# Patient Record
Sex: Female | Born: 1990
Health system: Southern US, Community
[De-identification: ages and names within clinical notes are randomized; demographics above are authoritative.]

## PROBLEM LIST (undated history)

## (undated) DIAGNOSIS — F32A Depression, unspecified: Secondary | ICD-10-CM

## (undated) DIAGNOSIS — I514 Myocarditis, unspecified: Secondary | ICD-10-CM

## (undated) DIAGNOSIS — F329 Major depressive disorder, single episode, unspecified: Secondary | ICD-10-CM

## (undated) DIAGNOSIS — F419 Anxiety disorder, unspecified: Secondary | ICD-10-CM

---
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
## 2009-08-19 ENCOUNTER — Other Ambulatory Visit: Admission: RE | Admit: 2009-08-19 | Discharge: 2009-08-19 | Payer: Self-pay | Admitting: Obstetrics and Gynecology

## 2010-10-07 ENCOUNTER — Other Ambulatory Visit (HOSPITAL_COMMUNITY)
Admission: RE | Admit: 2010-10-07 | Discharge: 2010-10-07 | Disposition: A | Discharge status: Home / Self Care | Payer: BC Managed Care – PPO | Source: Ambulatory Visit | Attending: Obstetrics and Gynecology | Admitting: Obstetrics and Gynecology

## 2010-10-07 DIAGNOSIS — Z113 Encounter for screening for infections with a predominantly sexual mode of transmission: Secondary | ICD-10-CM | POA: Insufficient documentation

## 2010-10-07 DIAGNOSIS — Z01419 Encounter for gynecological examination (general) (routine) without abnormal findings: Secondary | ICD-10-CM | POA: Insufficient documentation

## 2010-12-01 LAB — POCT I-STAT CREATININE: Creatinine, Ser: 0.9

## 2010-12-01 LAB — I-STAT 8, (EC8 V) (CONVERTED LAB)
BUN: 7
Chloride: 109
Glucose, Bld: 87
Operator id: 257131
Potassium: 3.8
Sodium: 141
pCO2, Ven: 32 — ABNORMAL LOW
pH, Ven: 7.443 — ABNORMAL HIGH

## 2010-12-01 LAB — CBC
HCT: 40
Hemoglobin: 13
MCV: 78.6 — ABNORMAL LOW
RBC: 5.09
RDW: 15.2 — ABNORMAL HIGH

## 2010-12-01 LAB — SAMPLE TO BLOOD BANK

## 2013-10-31 ENCOUNTER — Ambulatory Visit (INDEPENDENT_AMBULATORY_CARE_PROVIDER_SITE_OTHER): Payer: BC Managed Care – PPO | Admitting: Psychiatry

## 2013-10-31 DIAGNOSIS — F3289 Other specified depressive episodes: Secondary | ICD-10-CM

## 2013-10-31 DIAGNOSIS — F411 Generalized anxiety disorder: Secondary | ICD-10-CM

## 2013-10-31 DIAGNOSIS — F329 Major depressive disorder, single episode, unspecified: Secondary | ICD-10-CM

## 2013-10-31 DIAGNOSIS — F1010 Alcohol abuse, uncomplicated: Secondary | ICD-10-CM

## 2013-11-05 ENCOUNTER — Ambulatory Visit (INDEPENDENT_AMBULATORY_CARE_PROVIDER_SITE_OTHER): Payer: BC Managed Care – PPO | Admitting: Psychiatry

## 2013-11-05 DIAGNOSIS — F1010 Alcohol abuse, uncomplicated: Secondary | ICD-10-CM

## 2013-11-05 DIAGNOSIS — F329 Major depressive disorder, single episode, unspecified: Secondary | ICD-10-CM

## 2013-11-05 DIAGNOSIS — F3289 Other specified depressive episodes: Secondary | ICD-10-CM

## 2013-11-05 DIAGNOSIS — F411 Generalized anxiety disorder: Secondary | ICD-10-CM

## 2013-11-12 ENCOUNTER — Ambulatory Visit (INDEPENDENT_AMBULATORY_CARE_PROVIDER_SITE_OTHER): Payer: BC Managed Care – PPO | Admitting: Psychiatry

## 2013-11-12 DIAGNOSIS — F3289 Other specified depressive episodes: Secondary | ICD-10-CM

## 2013-11-12 DIAGNOSIS — F329 Major depressive disorder, single episode, unspecified: Secondary | ICD-10-CM

## 2013-11-26 ENCOUNTER — Ambulatory Visit (INDEPENDENT_AMBULATORY_CARE_PROVIDER_SITE_OTHER): Payer: BC Managed Care – PPO | Admitting: Psychiatry

## 2013-11-26 DIAGNOSIS — F1010 Alcohol abuse, uncomplicated: Secondary | ICD-10-CM

## 2013-11-26 DIAGNOSIS — F329 Major depressive disorder, single episode, unspecified: Secondary | ICD-10-CM

## 2013-11-26 DIAGNOSIS — F411 Generalized anxiety disorder: Secondary | ICD-10-CM

## 2013-12-10 ENCOUNTER — Ambulatory Visit: Payer: BC Managed Care – PPO | Admitting: Psychiatry

## 2016-12-08 DIAGNOSIS — F1010 Alcohol abuse, uncomplicated: Secondary | ICD-10-CM | POA: Diagnosis not present

## 2016-12-08 DIAGNOSIS — F411 Generalized anxiety disorder: Secondary | ICD-10-CM | POA: Diagnosis not present

## 2017-03-05 ENCOUNTER — Ambulatory Visit (HOSPITAL_COMMUNITY)
Admission: EM | Admit: 2017-03-05 | Discharge: 2017-03-05 | Disposition: A | Discharge status: Home / Self Care | Payer: BLUE CROSS/BLUE SHIELD | Attending: Family Medicine | Admitting: Family Medicine

## 2017-03-05 ENCOUNTER — Other Ambulatory Visit: Payer: Self-pay

## 2017-03-05 ENCOUNTER — Encounter (HOSPITAL_COMMUNITY): Payer: Self-pay | Admitting: *Deleted

## 2017-03-05 DIAGNOSIS — J029 Acute pharyngitis, unspecified: Secondary | ICD-10-CM | POA: Diagnosis not present

## 2017-03-05 DIAGNOSIS — Z79899 Other long term (current) drug therapy: Secondary | ICD-10-CM | POA: Diagnosis not present

## 2017-03-05 LAB — POCT RAPID STREP A: STREPTOCOCCUS, GROUP A SCREEN (DIRECT): NEGATIVE

## 2017-03-05 MED ORDER — CHLORHEXIDINE GLUCONATE 0.12 % MT SOLN: 15.0000 mL | Freq: Two times a day (BID) | OROMUCOSAL | 0 refills | Status: DC

## 2017-03-05 NOTE — ED Provider Notes (Signed)
  [facility]   [id number] 03/05/17 Arrival Time: 1557   SUBJECTIVE:  Claire ShownCasey D Chea is a 27 y.o. female who presents to the urgent care with complaint of sore throat x approx 1 month.  Describes intermittent lymphadenopathy x 2 wks with exterior right neck tenderness.  Denies fevers.  C/O some fatigue, otherwise "feel fine".  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 No pertinent past medical history. History reviewed. No pertinent family history. Social History   Socioeconomic History  . Marital status: Single    Spouse name: Not on file  . Number of children: Not on file  . Years of education: Not on file  . Highest education level: Not on file  Social Needs  . Financial resource strain: Not on file  . Food insecurity - worry: Not on file  . Food insecurity - inability: Not on file  . Transportation needs - medical: Not on file  . Transportation needs - non-medical: Not on file  Occupational History  . Not on file  Tobacco Use  . Smoking status: Never Smoker  Substance and Sexual Activity  . Alcohol use: Yes    Comment: occasionally  . Drug use: No  . Sexual activity: Not on file  Other Topics Concern  . Not on file  Social History Narrative  . Not on file   Current Meds  Medication Sig  . Venlafaxine HCl (EFFEXOR XR PO) Take by mouth.   No Known Allergies    ROS: As per HPI, remainder of ROS negative.   OBJECTIVE:   Vitals:   03/05/17 1608  BP: 133/80  Pulse: (!) 103  Resp: 18  Temp: 98.6 F (37 C)  TempSrc: Oral  SpO2: 100%     General appearance: alert; no distress Eyes: PERRL; EOMI; conjunctiva normal HENT: normocephalic; atraumatic; TMs normal, canal normal, external ears normal without trauma; nasal mucosa normal; oral mucosa normal Neck: supple Abdomen: soft, non-tender; bowel sounds normal; no masses or organomegaly; no guarding or rebound  tenderness Back: no CVA tenderness Extremities: no cyanosis or edema; symmetrical with no gross deformities Skin: warm and dry Neurologic: normal gait; grossly normal Psychological: alert and cooperative; normal mood and affect      Labs:  Results for orders placed or performed during the hospital encounter of 03/05/17  POCT rapid strep A [facility]  Result Value Ref Range   Streptococcus, Group A Screen (Direct) NEGATIVE NEGATIVE    Labs Reviewed  CULTURE, GROUP A STREP [facility])  POCT RAPID STREP A    No results found.     ASSESSMENT & PLAN:  1. Sore throat     Meds ordered this encounter  Medications  . chlorhexidine (PERIDEX) 0.12 % solution    Sig: Use as directed 15 mLs in the mouth or throat 2 (two) times daily.    Dispense:  120 mL    Refill:  0    Reviewed expectations re: course of current medical issues. Questions answered. Outlined signs and symptoms indicating need for more acute intervention. Patient verbalized understanding. After Visit Summary given.    Procedures:      Elvina SidleLauenstein, Bassem Bernasconi, MD 03/05/17 1645

## 2017-03-05 NOTE — ED Triage Notes (Signed)
C/O sore throat x approx 1 month.  Describes intermittent lymphadenopathy x 2 wks with exterior right neck tenderness.  Denies fevers.  C/O some fatigue, otherwise "feel fine".

## 2017-03-08 LAB — CULTURE, GROUP A STREP (THRC)

## 2017-11-16 ENCOUNTER — Encounter (HOSPITAL_COMMUNITY): Payer: Self-pay

## 2017-11-16 ENCOUNTER — Inpatient Hospital Stay (HOSPITAL_COMMUNITY)
Admission: AD | Admit: 2017-11-16 | Discharge: 2017-11-17 | Disposition: A | Discharge status: Home / Self Care | Payer: BLUE CROSS/BLUE SHIELD | Source: Ambulatory Visit | Attending: Obstetrics & Gynecology | Admitting: Obstetrics & Gynecology

## 2017-11-16 DIAGNOSIS — R1032 Left lower quadrant pain: Secondary | ICD-10-CM | POA: Insufficient documentation

## 2017-11-16 DIAGNOSIS — R109 Unspecified abdominal pain: Secondary | ICD-10-CM | POA: Diagnosis not present

## 2017-11-16 DIAGNOSIS — R82998 Other abnormal findings in urine: Secondary | ICD-10-CM

## 2017-11-16 LAB — CBC WITH DIFFERENTIAL/PLATELET
BASOS PCT: 1 %
Basophils Absolute: 0 10*3/uL (ref 0.0–0.1)
Eosinophils Absolute: 0.3 10*3/uL (ref 0.0–0.7)
Eosinophils Relative: 5 %
HEMATOCRIT: 37.8 % (ref 36.0–46.0)
Hemoglobin: 12.2 g/dL (ref 12.0–15.0)
Lymphocytes Relative: 37 %
Lymphs Abs: 2.4 10*3/uL (ref 0.7–4.0)
MCH: 26.8 pg (ref 26.0–34.0)
MCHC: 32.3 g/dL (ref 30.0–36.0)
MCV: 82.9 fL (ref 78.0–100.0)
MONOS PCT: 4 %
Monocytes Absolute: 0.2 10*3/uL (ref 0.1–1.0)
NEUTROS ABS: 3.5 10*3/uL (ref 1.7–7.7)
NEUTROS PCT: 53 %
Platelets: 231 10*3/uL (ref 150–400)
RBC: 4.56 MIL/uL (ref 3.87–5.11)
RDW: 14.2 % (ref 11.5–15.5)
WBC: 6.5 10*3/uL (ref 4.0–10.5)

## 2017-11-16 LAB — COMPREHENSIVE METABOLIC PANEL
ALBUMIN: 4.6 g/dL (ref 3.5–5.0)
ALT: 12 U/L (ref 0–44)
ANION GAP: 8 (ref 5–15)
AST: 15 U/L (ref 15–41)
Alkaline Phosphatase: 44 U/L (ref 38–126)
BILIRUBIN TOTAL: 0.5 mg/dL (ref 0.3–1.2)
BUN: 9 mg/dL (ref 6–20)
CALCIUM: 9 mg/dL (ref 8.9–10.3)
CO2: 26 mmol/L (ref 22–32)
Chloride: 105 mmol/L (ref 98–111)
Creatinine, Ser: 0.65 mg/dL (ref 0.44–1.00)
GFR calc Af Amer: >60 mL/min (ref >60)
GLUCOSE: 87 mg/dL (ref 70–99)
Potassium: 3.8 mmol/L (ref 3.5–5.1)
Sodium: 139 mmol/L (ref 135–145)
TOTAL PROTEIN: 6.9 g/dL (ref 6.5–8.1)

## 2017-11-16 LAB — URINALYSIS, ROUTINE W REFLEX MICROSCOPIC
Bilirubin Urine: NEGATIVE
Glucose, UA: NEGATIVE mg/dL
Hgb urine dipstick: NEGATIVE
KETONES UR: NEGATIVE mg/dL
Nitrite: NEGATIVE
PROTEIN: NEGATIVE mg/dL
Specific Gravity, Urine: 1.012 (ref 1.005–1.030)
pH: 6 (ref 5.0–8.0)

## 2017-11-16 LAB — LIPASE, BLOOD: Lipase: 35 U/L (ref 11–51)

## 2017-11-16 LAB — POCT PREGNANCY, URINE: Preg Test, Ur: NEGATIVE

## 2017-11-16 LAB — AMYLASE: Amylase: 69 U/L (ref 28–100)

## 2017-11-16 NOTE — Discharge Instructions (Signed)

## 2017-11-16 NOTE — MAU Provider Note (Signed)
Chief Complaint:  Hip Pain   First Provider Initiated Contact with Patient 11/16/17 2207      HPI: Diana Gomez is a 27 y.o. G1P0010 who presents to maternity admissions reporting dull ache in left side from iliac crest to left flank for the past 6 months.  States it has gotten worse lately.  Mostly feels worse after eating. States feels full for a long time after eating.  Has an appointment Monday with her PCP to evaluate it.  Not worried about STDs. . She reports no vaginal bleeding, vaginal itching/burning, urinary symptoms, h/a, dizziness, n/v, or fever/chills.    Abdominal Pain  This is a recurrent problem. The current episode started more than 1 month ago. The onset quality is gradual. The problem occurs intermittently. The problem has been gradually worsening. The pain is located in the LLQ, LUQ and left flank. The quality of the pain is dull. Pertinent negatives include no anorexia, constipation, diarrhea, dysuria, fever, frequency, myalgias, nausea or vomiting. The pain is aggravated by eating (Feels full for a long time after eating). The pain is relieved by nothing. She has tried nothing for the symptoms.    RN note: Feeling a dull ache in her left side for the past 6 months.  In the past 2 weeks has gotten worse and is experiencing feeling a lot of pressure in her side.  LMP 10/22/17.  No VB.  Has not taken anything for the pain recently.  Has an appointment on Monday with a PCP  Past Medical History: History reviewed. No pertinent past medical history.  Past obstetric history: OB History  Gravida Para Term Preterm AB Living  1       1    SAB TAB Ectopic Multiple Live Births    1          # Outcome Date GA Lbr Len/2nd Weight Sex Delivery Anes PTL Lv  1 TAB 2014            Past Surgical History: History reviewed. No pertinent surgical history.  Family History: No family history on file.  Social History: Social History   Tobacco Use  . Smoking status: Never  Smoker  Substance Use Topics  . Alcohol use: Yes    Comment: occasionally  . Drug use: No    Allergies: No Known Allergies  Meds:  Medications Prior to Admission  Medication Sig Dispense Refill Last Dose  . Venlafaxine HCl (EFFEXOR XR PO) Take 150 mg by mouth.    11/16/2017 at Unknown time  . chlorhexidine (PERIDEX) 0.12 % solution Use as directed 15 mLs in the mouth or throat 2 (two) times daily. 120 mL 0     I have reviewed patient's Past Medical Hx, Surgical Hx, Family Hx, Social Hx, medications and allergies.  ROS:  Review of Systems  Constitutional: Negative for fever.  Gastrointestinal: Positive for abdominal pain. Negative for anorexia, constipation, diarrhea, nausea and vomiting.  Genitourinary: Negative for dysuria and frequency.  Musculoskeletal: Negative for myalgias.   Other systems negative     Physical Exam   Patient Vitals for the past 24 hrs:  BP Temp Pulse Resp SpO2 Height Weight  11/16/17 2107 140/77 99.2 F (37.3 C) 100 19 100 % 5\' 3"  (1.6 m) 60.7 kg   Constitutional: Well-developed, well-nourished female in no acute distress.  Cardiovascular: normal rate and rhythm, no ectopy audible, S1 & S2 heard, no murmur Respiratory: normal effort, no distress. Lungs CTAB with no wheezes or crackles GI: Abd  soft, non-tender.  Nondistended.  No rebound, No guarding.  Bowel Sounds audible  MS: Extremities nontender, no edema, normal ROM Neurologic: Alert and oriented x 4.   Grossly nonfocal. GU: Neg CVAT. Skin:  Warm and Dry Psych:  Affect appropriate.  PELVIC EXAM: Cervix pink, visually closed, without lesion, scant white creamy discharge, vaginal walls and external genitalia normal Bimanual exam: Cervix firm, anterior, neg CMT, uterus nontender, nonenlarged, adnexa without tenderness, enlargement, or mass   Pressure on Left adnexa does NOT reproduce her pain.    Labs: Results for orders placed or performed during the hospital encounter of 11/16/17 (from the  past 24 hour(s))  Urinalysis, Routine w reflex microscopic     Status: Abnormal   Collection Time: 11/16/17  9:30 PM  Result Value Ref Range   Color, Urine YELLOW YELLOW   APPearance CLEAR CLEAR   Specific Gravity, Urine 1.012 1.005 - 1.030   pH 6.0 5.0 - 8.0   Glucose, UA NEGATIVE NEGATIVE mg/dL   Hgb urine dipstick NEGATIVE NEGATIVE   Bilirubin Urine NEGATIVE NEGATIVE   Ketones, ur NEGATIVE NEGATIVE mg/dL   Protein, ur NEGATIVE NEGATIVE mg/dL   Nitrite NEGATIVE NEGATIVE   Leukocytes, UA MODERATE (A) NEGATIVE   RBC / HPF 0-5 0 - 5 RBC/hpf   WBC, UA 0-5 0 - 5 WBC/hpf   Bacteria, UA RARE (A) NONE SEEN   Squamous Epithelial / LPF 0-5 0 - 5   Mucus PRESENT   Pregnancy, urine POC     Status: None   Collection Time: 11/16/17  9:34 PM  Result Value Ref Range   Preg Test, Ur NEGATIVE NEGATIVE  CBC with Differential/Platelet     Status: None   Collection Time: 11/16/17 10:32 PM  Result Value Ref Range   WBC 6.5 4.0 - 10.5 K/uL   RBC 4.56 3.87 - 5.11 MIL/uL   Hemoglobin 12.2 12.0 - 15.0 g/dL   HCT 40.9 81.1 - 91.4 %   MCV 82.9 78.0 - 100.0 fL   MCH 26.8 26.0 - 34.0 pg   MCHC 32.3 30.0 - 36.0 g/dL   RDW 78.2 95.6 - 21.3 %   Platelets 231 150 - 400 K/uL   Neutrophils Relative % 53 %   Neutro Abs 3.5 1.7 - 7.7 K/uL   Lymphocytes Relative 37 %   Lymphs Abs 2.4 0.7 - 4.0 K/uL   Monocytes Relative 4 %   Monocytes Absolute 0.2 0.1 - 1.0 K/uL   Eosinophils Relative 5 %   Eosinophils Absolute 0.3 0.0 - 0.7 K/uL   Basophils Relative 1 %   Basophils Absolute 0.0 0.0 - 0.1 K/uL  Comprehensive metabolic panel     Status: None   Collection Time: 11/16/17 10:32 PM  Result Value Ref Range   Sodium 139 135 - 145 mmol/L   Potassium 3.8 3.5 - 5.1 mmol/L   Chloride 105 98 - 111 mmol/L   CO2 26 22 - 32 mmol/L   Glucose, Bld 87 70 - 99 mg/dL   BUN 9 6 - 20 mg/dL   Creatinine, Ser 0.86 0.44 - 1.00 mg/dL   Calcium 9.0 8.9 - 57.8 mg/dL   Total Protein 6.9 6.5 - 8.1 g/dL   Albumin 4.6 3.5 -  5.0 g/dL   AST 15 15 - 41 U/L   ALT 12 0 - 44 U/L   Alkaline Phosphatase 44 38 - 126 U/L   Total Bilirubin 0.5 0.3 - 1.2 mg/dL   GFR calc non Af Amer >60 >60  mL/min   GFR calc Af Amer >60 >60 mL/min   Anion gap 8 5 - 15  Lipase, blood     Status: None   Collection Time: 11/16/17 10:32 PM  Result Value Ref Range   Lipase 35 11 - 51 U/L  Amylase     Status: None   Collection Time: 11/16/17 10:32 PM  Result Value Ref Range   Amylase 69 28 - 100 U/L    Imaging:  No results found.  MAU Course/MDM: I have ordered labs as follows: see above.  Reviewed CBC and CMET are normal, as are lipase and amylase Leukocytes in urine might reflect UTI.  Will culture and treat with short course Septra Ds Imaging ordered: none Results reviewed.    Treatments in MAU included none.   Pt stable at time of discharge.  Assessment: Left lower abdominal pain Left flank pain Feeling full for prolonged time after eating  Plan: Discharge home Recommend Follow up with primary doctor and notify him you were seen here  Rx sent for Septra DS for Possible UTI x 3 days, urine to culture  Encouraged to return here or to other Urgent Care/ED if she develops worsening of symptoms, increase in pain, fever, or other concerning symptoms.   Wynelle Bourgeois CNM, MSN Certified Nurse-Midwife 11/16/2017 10:08 PM

## 2017-11-16 NOTE — MAU Note (Signed)
Feeling a dull ache in her left side for the past 6 months.  In the past 2 weeks has gotten worse and is experiencing feeling a lot of pressure in her side.  LMP 10/22/17.  No VB.  Has not taken anything for the pain recently.  Has an appointment on Monday with a PCP.

## 2017-11-17 MED ORDER — SULFAMETHOXAZOLE-TRIMETHOPRIM 800-160 MG PO TABS: 1.0000 | ORAL_TABLET | Freq: Two times a day (BID) | ORAL | 1 refills | Status: DC

## 2017-11-18 LAB — URINE CULTURE: Culture: NO GROWTH

## 2017-11-22 DIAGNOSIS — R1032 Left lower quadrant pain: Secondary | ICD-10-CM | POA: Diagnosis not present

## 2017-11-27 ENCOUNTER — Encounter (HOSPITAL_BASED_OUTPATIENT_CLINIC_OR_DEPARTMENT_OTHER): Payer: Self-pay | Admitting: *Deleted

## 2017-11-27 ENCOUNTER — Emergency Department (HOSPITAL_BASED_OUTPATIENT_CLINIC_OR_DEPARTMENT_OTHER): Payer: BLUE CROSS/BLUE SHIELD

## 2017-11-27 ENCOUNTER — Other Ambulatory Visit: Payer: Self-pay

## 2017-11-27 ENCOUNTER — Emergency Department (HOSPITAL_BASED_OUTPATIENT_CLINIC_OR_DEPARTMENT_OTHER)
Admission: EM | Admit: 2017-11-27 | Discharge: 2017-11-27 | Disposition: A | Discharge status: Home / Self Care | Payer: BLUE CROSS/BLUE SHIELD | Attending: Emergency Medicine | Admitting: Emergency Medicine

## 2017-11-27 DIAGNOSIS — R1032 Left lower quadrant pain: Secondary | ICD-10-CM | POA: Diagnosis not present

## 2017-11-27 DIAGNOSIS — Z79899 Other long term (current) drug therapy: Secondary | ICD-10-CM | POA: Diagnosis not present

## 2017-11-27 DIAGNOSIS — R109 Unspecified abdominal pain: Secondary | ICD-10-CM | POA: Diagnosis not present

## 2017-11-27 DIAGNOSIS — K529 Noninfective gastroenteritis and colitis, unspecified: Secondary | ICD-10-CM | POA: Diagnosis not present

## 2017-11-27 LAB — CBC WITH DIFFERENTIAL/PLATELET
BASOS ABS: 0 10*3/uL (ref 0.0–0.1)
Basophils Relative: 1 %
Eosinophils Absolute: 0.1 10*3/uL (ref 0.0–0.7)
Eosinophils Relative: 2 %
HEMATOCRIT: 39.2 % (ref 36.0–46.0)
Hemoglobin: 13.1 g/dL (ref 12.0–15.0)
LYMPHS ABS: 1.4 10*3/uL (ref 0.7–4.0)
LYMPHS PCT: 26 %
MCH: 27.1 pg (ref 26.0–34.0)
MCHC: 33.4 g/dL (ref 30.0–36.0)
MCV: 81 fL (ref 78.0–100.0)
Monocytes Absolute: 0.4 10*3/uL (ref 0.1–1.0)
Monocytes Relative: 8 %
NEUTROS ABS: 3.4 10*3/uL (ref 1.7–7.7)
Neutrophils Relative %: 65 %
Platelets: 200 10*3/uL (ref 150–400)
RBC: 4.84 MIL/uL (ref 3.87–5.11)
RDW: 13.6 % (ref 11.5–15.5)
WBC: 5.3 10*3/uL (ref 4.0–10.5)

## 2017-11-27 LAB — COMPREHENSIVE METABOLIC PANEL
ALK PHOS: 48 U/L (ref 38–126)
ALT: 14 U/L (ref 0–44)
AST: 19 U/L (ref 15–41)
Albumin: 4.6 g/dL (ref 3.5–5.0)
Anion gap: 10 (ref 5–15)
BILIRUBIN TOTAL: 0.5 mg/dL (ref 0.3–1.2)
BUN: 7 mg/dL (ref 6–20)
CO2: 25 mmol/L (ref 22–32)
Calcium: 9.4 mg/dL (ref 8.9–10.3)
Chloride: 102 mmol/L (ref 98–111)
Creatinine, Ser: 0.7 mg/dL (ref 0.44–1.00)
GFR calc Af Amer: >60 mL/min (ref >60)
GLUCOSE: 88 mg/dL (ref 70–99)
Potassium: 3.2 mmol/L — ABNORMAL LOW (ref 3.5–5.1)
Sodium: 137 mmol/L (ref 135–145)
Total Protein: 7.4 g/dL (ref 6.5–8.1)

## 2017-11-27 LAB — URINALYSIS, ROUTINE W REFLEX MICROSCOPIC
Bilirubin Urine: NEGATIVE
Glucose, UA: NEGATIVE mg/dL
HGB URINE DIPSTICK: NEGATIVE
Ketones, ur: NEGATIVE mg/dL
Leukocytes, UA: NEGATIVE
NITRITE: NEGATIVE
PH: 7 (ref 5.0–8.0)
Protein, ur: NEGATIVE mg/dL
Specific Gravity, Urine: <1.005 — ABNORMAL LOW (ref 1.005–1.030)

## 2017-11-27 LAB — LIPASE, BLOOD: Lipase: 25 U/L (ref 11–51)

## 2017-11-27 LAB — PREGNANCY, URINE: PREG TEST UR: NEGATIVE

## 2017-11-27 MED ORDER — AMOXICILLIN-POT CLAVULANATE 875-125 MG PO TABS
1.0000 | ORAL_TABLET | Freq: Once | ORAL | Status: AC
  Administered 2017-11-27: 1 via ORAL
  Filled 2017-11-27: qty 1

## 2017-11-27 MED ORDER — AMOXICILLIN-POT CLAVULANATE 875-125 MG PO TABS: 1.0000 | ORAL_TABLET | Freq: Two times a day (BID) | ORAL | 0 refills | Status: AC

## 2017-11-27 MED ORDER — IOPAMIDOL (ISOVUE-M 300) INJECTION 61%: 15.0000 mL | Freq: Once | INTRAMUSCULAR | Status: DC | PRN

## 2017-11-27 MED ORDER — OXYCODONE-ACETAMINOPHEN 5-325 MG PO TABS
1.0000 | ORAL_TABLET | Freq: Once | ORAL | Status: AC
  Administered 2017-11-27: 1 via ORAL
  Filled 2017-11-27: qty 1

## 2017-11-27 MED ORDER — IOPAMIDOL (ISOVUE-300) INJECTION 61%
100.0000 mL | Freq: Once | INTRAVENOUS | Status: AC | PRN
  Administered 2017-11-27: 100 mL via INTRAVENOUS

## 2017-11-27 MED ORDER — OXYCODONE-ACETAMINOPHEN 5-325 MG PO TABS: 1.0000 | ORAL_TABLET | Freq: Four times a day (QID) | ORAL | 0 refills | Status: DC | PRN

## 2017-11-27 MED ORDER — GI COCKTAIL ~~LOC~~
30.0000 mL | Freq: Once | ORAL | Status: AC
  Administered 2017-11-27: 30 mL via ORAL
  Filled 2017-11-27: qty 30

## 2017-11-27 MED ORDER — ONDANSETRON HCL 4 MG/2ML IJ SOLN
4.0000 mg | Freq: Once | INTRAMUSCULAR | Status: AC
  Administered 2017-11-27: 4 mg via INTRAVENOUS
  Filled 2017-11-27: qty 2

## 2017-11-27 MED ORDER — SODIUM CHLORIDE 0.9 % IV BOLUS
1000.0000 mL | Freq: Once | INTRAVENOUS | Status: AC
  Administered 2017-11-27: 1000 mL via INTRAVENOUS

## 2017-11-27 MED ORDER — DICYCLOMINE HCL 10 MG PO CAPS
20.0000 mg | ORAL_CAPSULE | Freq: Once | ORAL | Status: AC
  Administered 2017-11-27: 20 mg via ORAL
  Filled 2017-11-27: qty 2

## 2017-11-27 MED ORDER — DICYCLOMINE HCL 20 MG PO TABS: 20.0000 mg | ORAL_TABLET | Freq: Three times a day (TID) | ORAL | 0 refills | Status: DC

## 2017-11-27 MED ORDER — MORPHINE SULFATE (PF) 4 MG/ML IV SOLN
4.0000 mg | Freq: Once | INTRAVENOUS | Status: AC
  Administered 2017-11-27: 4 mg via INTRAVENOUS
  Filled 2017-11-27: qty 1

## 2017-11-27 NOTE — Discharge Instructions (Addendum)
As we discussed, your CT scan today showed colitis. This could be infectious or inflammatory. For now, we'll start an antibiotic and symptom treatment.  Given how long your symptoms have lasted, I'd recommend being seen by a GI doctor in the next few weeks, to make sure there is nothing else that needs to be done.

## 2017-11-27 NOTE — ED Provider Notes (Signed)
[facility] EMERGENCY DEPARTMENT Provider Note   CSN: [id number] Arrival date & time: 11/27/17  1639     History   Chief Complaint Chief Complaint  Patient presents with  . Abdominal Pain    HPI Diana Gomez is a 27 y.o. female.  HPI 27 year old female here with diffuse abdominal pain.  The patient states that over the last 2 to 3 months, she has been having intermittent left lower quadrant pain.  She has had an associated 10 pound weight loss.  She saw her PCP on 9/30 and had an ultrasound which showed possible PCOS but no large or hemorrhagic cyst.  She states that since then, she is continued to have intermittent left lower quadrant pain.  However, over the last 24 hours, she is developed now diffuse, aching, severe pain.  Describes it as a stabbing, aching sensation with associated fullness.  She is had pain that worsens after eating.  Denies any alleviating factors other than intermittent position changes.  She is had increased urinary frequency but denies any overt dysuria or hematuria.  No change in her bowel habits.  She had one episode of nausea and vomiting which she thought was due to pain yesterday.  She strongly denies any vaginal bleeding or discharge and had a unremarkable pelvic exam and negative GC/C just several days ago.  Denies any recent change in her menstrual periods.  No other medical history.  Mother did have a history of early colon cancer.  Father had a history of kidney stones.  History reviewed. No pertinent past medical history.  There are no active problems to display for this patient.   History reviewed. No pertinent surgical history.   OB History    Gravida  1   Para      Term      Preterm      AB  1   Living        SAB      TAB  1   Ectopic      Multiple      Live Births               Home Medications    Prior to Admission medications   Medication Sig Start Date End Date Taking? Authorizing Provider    raNITIdine HCl (ZANTAC PO) Take by mouth.   Yes [provider]  venlafaxine XR (EFFEXOR-XR) 150 MG 24 hr capsule TAKE 1 CAPSULE BY MOUTH EVERY DAY 10/02/17  Yes [provider]  amoxicillin-clavulanate (AUGMENTIN) 875-125 MG tablet Take 1 tablet by mouth every 12 (twelve) hours for 10 days. 11/27/17 12/07/17  Shaune Pollack, MD  chlorhexidine (PERIDEX) 0.12 % solution Use as directed 15 mLs in the mouth or throat 2 (two) times daily. 03/05/17   Elvina Sidle, MD  dicyclomine (BENTYL) 20 MG tablet Take 1 tablet (20 mg total) by mouth 3 (three) times daily before meals for 10 days. 11/27/17 12/07/17  Shaune Pollack, MD  oxyCODONE-acetaminophen (PERCOCET/ROXICET) 5-325 MG tablet Take 1-2 tablets by mouth every 6 (six) hours as needed for moderate pain or severe pain. 11/27/17   Shaune Pollack, MD  sulfamethoxazole-trimethoprim (BACTRIM DS,SEPTRA DS) 800-160 MG tablet Take 1 tablet by mouth 2 (two) times daily. 11/17/17   Aviva Signs, CNM    Family History No family history on file.  Social History Social History   Tobacco Use  . Smoking status: Never Smoker  . Smokeless tobacco: Never Used  Substance Use Topics  . Alcohol  use: Yes    Comment: occasionally  . Drug use: No     Allergies   Patient has no known allergies.   Review of Systems Review of Systems  Constitutional: Positive for fatigue. Negative for chills and fever.  HENT: Negative for congestion and rhinorrhea.   Eyes: Negative for visual disturbance.  Respiratory: Negative for cough, shortness of breath and wheezing.   Cardiovascular: Negative for chest pain and leg swelling.  Gastrointestinal: Positive for abdominal pain and nausea. Negative for diarrhea and vomiting.  Genitourinary: Negative for dysuria and flank pain.  Musculoskeletal: Negative for neck pain and neck stiffness.  Skin: Negative for rash and wound.  Allergic/Immunologic: Negative for immunocompromised state.  Neurological:  Positive for weakness. Negative for syncope and headaches.  All other systems reviewed and are negative.    Physical Exam Updated Vital Signs BP 123/82 (BP Location: Left Arm)   Pulse 77   Temp 98.2 F (36.8 C) (Oral)   Resp 16   Ht 5\' 3"  (1.6 m)   Wt 60.6 kg   LMP 11/19/2017   SpO2 98%   BMI 23.67 kg/m   Physical Exam  Constitutional: She is oriented to person, place, and time. She appears well-developed and well-nourished. No distress.  HENT:  Head: Normocephalic and atraumatic.  Eyes: Conjunctivae are normal.  Neck: Neck supple.  Cardiovascular: Normal rate, regular rhythm and normal heart sounds. Exam reveals no friction rub.  No murmur heard. Pulmonary/Chest: Effort normal and breath sounds normal. No respiratory distress. She has no wheezes. She has no rales.  Abdominal: Soft. Normal appearance and bowel sounds are normal. She exhibits no distension. There is generalized tenderness. There is guarding. There is no rigidity and no rebound.  No CVAT bilaterally  Musculoskeletal: She exhibits no edema.  Neurological: She is alert and oriented to person, place, and time. She exhibits normal muscle tone.  Skin: Skin is warm. Capillary refill takes less than 2 seconds.  Psychiatric: She has a normal mood and affect.  Nursing note and vitals reviewed.    ED Treatments / Results  Labs (all labs ordered are listed, but only abnormal results are displayed) Labs Reviewed  URINALYSIS, ROUTINE W REFLEX MICROSCOPIC - Abnormal; Notable for the following components:      Result Value   Specific Gravity, Urine <1.005 (*)    All other components within normal limits  COMPREHENSIVE METABOLIC PANEL - Abnormal; Notable for the following components:   Potassium 3.2 (*)    All other components within normal limits  PREGNANCY, URINE  CBC WITH DIFFERENTIAL/PLATELET  LIPASE, BLOOD    COMPARISON:None. TECHNIQUE:Transvaginal imaging only. Color and Grayscale images. INDICATION:  LLQ pelvic pain for 6 months getting worse esp around menses.   FINDINGS:  UTERUS: - 7.8 x 3.2 x 4.0 cm. - No focal uterine abnormality is seen. - No focal lesion. Endometrium 6 mm in thickness.  CUL-DE-SAC: - No significant free fluid.  OVARIES: - Right ovary is 2.3 x 1.8 x 2.4 cm with Doppler evidence of arterial venous flow. There are a large number of follicles, largest 1.7 cm. - Left ovary measures 2.8 x 1.8 x 2.4 cm. There is Doppler evidence of arterial venous flow. Multiple follicles are demonstrated. - No abnormal solid ovarian masses.  ADDITIONAL/INCIDENTAL: - N./A.    Other Result Information  Acute Interface, Incoming Rad Results - 11/23/2017  2:14 PM EDT COMPARISON:  None.   TECHNIQUE:  Transvaginal imaging only. Color and Grayscale images. INDICATION: LLQ pelvic pain for 6  months getting worse esp around menses.   FINDINGS:  UTERUS: - 7.8 x 3.2 x 4.0 cm. - No focal uterine abnormality is seen. - No focal lesion. Endometrium 6 mm in thickness.  CUL-DE-SAC: - No significant free fluid.  OVARIES: - Right ovary is 2.3 x 1.8 x 2.4 cm with Doppler evidence of arterial venous flow. There are a large number of follicles, largest 1.7 cm. - Left ovary measures 2.8 x 1.8 x 2.4 cm. There is Doppler evidence of arterial venous flow. Multiple follicles are demonstrated. - No abnormal solid ovarian masses.  ADDITIONAL/INCIDENTAL: - N./A.   IMPRESSION:         No visualized acute abnormality. Large number of ovarian follicles, nonspecific but which can sometimes be seen in the clinical setting of PCOS.  Electronically Signed by: Kathi Simpers    EKG None  Radiology Ct Abdomen Pelvis W Contrast  Result Date: 11/27/2017 CLINICAL DATA:  Left abdominal pain for 3 months and right abdominal pain for 2 weeks EXAM: CT ABDOMEN AND PELVIS WITH CONTRAST TECHNIQUE: Multidetector CT imaging of the abdomen and pelvis was performed using the standard protocol  following bolus administration of intravenous contrast. CONTRAST:  ISOVUE-300 IOPAMIDOL (ISOVUE-300) INJECTION 61% COMPARISON:  CT abdomen 12/10/2006 FINDINGS: Lower chest: Unremarkable Hepatobiliary: Unremarkable Pancreas: Unremarkable Spleen: Unremarkable Adrenals/Urinary Tract: Unremarkable Stomach/Bowel: Nondistention and questionable bowel wall thickening in the transverse, descending, and sigmoid colon. Appendix 6 mm in diameter, within normal limits. No dilated bowel. Vascular/Lymphatic: Unremarkable Reproductive: Unremarkable Other: No supplemental non-categorized findings. Musculoskeletal: Unremarkable IMPRESSION: 1. Nondistention and possible wall thickening in the transverse, descending, and sigmoid colon. I cannot exclude a low-grade colitis. No ascites or paracolic stranding. Normal appendix. Electronically Signed   By: Gaylyn Rong M.D.   On: 11/27/2017 20:35    Procedures Procedures (including critical care time)  Medications Ordered in ED Medications  sodium chloride 0.9 % bolus 1,000 mL ( Intravenous Stopped 11/27/17 1905)  ondansetron (ZOFRAN) injection 4 mg (4 mg Intravenous Given 11/27/17 1745)  morphine 4 MG/ML injection 4 mg (4 mg Intravenous Given 11/27/17 1745)  gi cocktail (Maalox,Lidocaine,Donnatal) (30 mLs Oral Given 11/27/17 1733)  iopamidol (ISOVUE-300) 61 % injection 100 mL (100 mLs Intravenous Contrast Given 11/27/17 1911)  oxyCODONE-acetaminophen (PERCOCET/ROXICET) 5-325 MG per tablet 1 tablet (1 tablet Oral Given 11/27/17 2111)  dicyclomine (BENTYL) capsule 20 mg (20 mg Oral Given 11/27/17 2111)  amoxicillin-clavulanate (AUGMENTIN) 875-125 MG per tablet 1 tablet (1 tablet Oral Given 11/27/17 2111)     Initial Impression / Assessment and Plan / ED Course  I have reviewed the triage vital signs and the nursing notes.  Pertinent labs & imaging results that were available during my care of the patient were reviewed by me and considered in my medical decision  making (see chart for details).    27 yo F here with diffuse abd pain. Family h/o early colon CA. Initial DDx is broad, includes IBS, IBD, gastritis, pancreatitis, colitis, diverticulitis, appendicitis, cholecystitis. She is s/p recent TVUS, with neg pelvic and pelvic swabs within last week - doubt GU etiology. Will follow-up UA as stone, UTI also on DDx. Fluids, meds given.  Labs very reassuring. CBC without leukocytosis. CMP at baseline. UA negative. CT scan is c/w colitis. No complication. Will start on Augmentin, supportive care, d/c home. Pt is o/w well appearing, tolerating PO. Given the chornicity of her sx, feel it's reasonable to refer her to GI for outpt follow-up, possible f/u colonoscopy given family history. This was discussed  in detail. D/c home.  Final Clinical Impressions(s) / ED Diagnoses   Final diagnoses:  Colitis    ED Discharge Orders         Ordered    oxyCODONE-acetaminophen (PERCOCET/ROXICET) 5-325 MG tablet  Every 6 hours PRN     11/27/17 2102    dicyclomine (BENTYL) 20 MG tablet  3 times daily before meals     11/27/17 2102    amoxicillin-clavulanate (AUGMENTIN) 875-125 MG tablet  Every 12 hours     11/27/17 2102           Shaune Pollack, MD 11/27/17 2301

## 2017-11-27 NOTE — ED Triage Notes (Signed)
A few months of left lower quadrant pain that has worsened over the past 3 weeks. Full feeling after eating.

## 2017-11-27 NOTE — ED Notes (Signed)
ED Provider at bedside. 

## 2017-12-11 DIAGNOSIS — K5900 Constipation, unspecified: Secondary | ICD-10-CM | POA: Diagnosis not present

## 2017-12-11 DIAGNOSIS — R933 Abnormal findings on diagnostic imaging of other parts of digestive tract: Secondary | ICD-10-CM | POA: Diagnosis not present

## 2017-12-11 DIAGNOSIS — R1084 Generalized abdominal pain: Secondary | ICD-10-CM | POA: Diagnosis not present

## 2017-12-13 DIAGNOSIS — R1084 Generalized abdominal pain: Secondary | ICD-10-CM | POA: Diagnosis not present

## 2017-12-13 DIAGNOSIS — D126 Benign neoplasm of colon, unspecified: Secondary | ICD-10-CM | POA: Diagnosis not present

## 2017-12-13 DIAGNOSIS — Z8379 Family history of other diseases of the digestive system: Secondary | ICD-10-CM | POA: Diagnosis not present

## 2017-12-13 DIAGNOSIS — R933 Abnormal findings on diagnostic imaging of other parts of digestive tract: Secondary | ICD-10-CM | POA: Diagnosis not present

## 2017-12-13 DIAGNOSIS — Z800 Family history of malignant neoplasm of digestive organs: Secondary | ICD-10-CM | POA: Diagnosis not present

## 2017-12-14 ENCOUNTER — Encounter (HOSPITAL_BASED_OUTPATIENT_CLINIC_OR_DEPARTMENT_OTHER): Payer: Self-pay

## 2017-12-14 ENCOUNTER — Emergency Department (HOSPITAL_BASED_OUTPATIENT_CLINIC_OR_DEPARTMENT_OTHER)
Admission: EM | Admit: 2017-12-14 | Discharge: 2017-12-14 | Disposition: A | Discharge status: Home / Self Care | Payer: BLUE CROSS/BLUE SHIELD | Attending: Emergency Medicine | Admitting: Emergency Medicine

## 2017-12-14 ENCOUNTER — Other Ambulatory Visit: Payer: Self-pay

## 2017-12-14 ENCOUNTER — Emergency Department (HOSPITAL_BASED_OUTPATIENT_CLINIC_OR_DEPARTMENT_OTHER): Payer: BLUE CROSS/BLUE SHIELD

## 2017-12-14 DIAGNOSIS — R1084 Generalized abdominal pain: Secondary | ICD-10-CM | POA: Insufficient documentation

## 2017-12-14 DIAGNOSIS — Z79899 Other long term (current) drug therapy: Secondary | ICD-10-CM | POA: Diagnosis not present

## 2017-12-14 DIAGNOSIS — R1011 Right upper quadrant pain: Secondary | ICD-10-CM | POA: Diagnosis not present

## 2017-12-14 DIAGNOSIS — K828 Other specified diseases of gallbladder: Secondary | ICD-10-CM | POA: Diagnosis not present

## 2017-12-14 DIAGNOSIS — K824 Cholesterolosis of gallbladder: Secondary | ICD-10-CM | POA: Diagnosis not present

## 2017-12-14 LAB — URINALYSIS, MICROSCOPIC (REFLEX): RBC / HPF: NONE SEEN RBC/hpf (ref 0–5)

## 2017-12-14 LAB — URINALYSIS, ROUTINE W REFLEX MICROSCOPIC
Bilirubin Urine: NEGATIVE
GLUCOSE, UA: NEGATIVE mg/dL
Hgb urine dipstick: NEGATIVE
Ketones, ur: NEGATIVE mg/dL
Nitrite: NEGATIVE
PROTEIN: NEGATIVE mg/dL
SPECIFIC GRAVITY, URINE: 1.01 (ref 1.005–1.030)
pH: 6 (ref 5.0–8.0)

## 2017-12-14 LAB — TSH: TSH: 1.628 u[IU]/mL (ref 0.350–4.500)

## 2017-12-14 LAB — PREGNANCY, URINE: Preg Test, Ur: NEGATIVE

## 2017-12-14 MED ORDER — DICYCLOMINE HCL 10 MG PO CAPS
10.0000 mg | ORAL_CAPSULE | Freq: Once | ORAL | Status: AC
  Administered 2017-12-14: 10 mg via ORAL
  Filled 2017-12-14: qty 1

## 2017-12-14 MED ORDER — KETOROLAC TROMETHAMINE 30 MG/ML IJ SOLN
15.0000 mg | Freq: Once | INTRAMUSCULAR | Status: AC
  Administered 2017-12-14: 15 mg via INTRAMUSCULAR
  Filled 2017-12-14: qty 1

## 2017-12-14 MED ORDER — DICYCLOMINE HCL 20 MG PO TABS: 20.0000 mg | ORAL_TABLET | Freq: Three times a day (TID) | ORAL | 0 refills | Status: DC | PRN

## 2017-12-14 NOTE — ED Triage Notes (Signed)
C/o right side abd pain, lower/mid back and shoulder blades, jaw and neck x 2 months-states she was seen at PCP and at [facility], here and with GI had colonoscopy yesterday-was advised to come to ED due to cont'd pain

## 2017-12-14 NOTE — ED Provider Notes (Signed)
[facility] EMERGENCY DEPARTMENT Provider Note   CSN: [id number] Arrival date & time: 12/14/17  1625     History   Chief Complaint Chief Complaint  Patient presents with  . Abdominal Pain    HPI Diana Gomez is a 27 y.o. female.  HPI Patient presents with upper abdominal pain.  Right upper quadrant going into the back.  States she is been having pain over the last around 3 to 4 months.  Was in lower abdomen but at times and upper abdomen to.  States last night it was very severe.  Has been seen in the ER at [facility] and by gastroenterology for this.  Had colonoscopy yesterday and reportedly only found a polyp.  No fevers.  Had around 10 pound loss of weight a couple months ago but has been stable since then.  Has had nausea with it at times but usually more of a pain issue.  She does smoke marijuana somewhat frequently.  She states it helps with her appetite.  No fevers.  No dysuria.  Pain is now more in the upper abdomen compared to before but is much improved from what it was last night.  States she hopes you are able to find out what no one else is been able to find the cause of.  She had a CT scan that showed possible colitis.  Had taken antibiotics but really did not change any of her pain. History reviewed. No pertinent past medical history.  There are no active problems to display for this patient.   History reviewed. No pertinent surgical history.   OB History    Gravida  1   Para      Term      Preterm      AB  1   Living        SAB      TAB  1   Ectopic      Multiple      Live Births               Home Medications    Prior to Admission medications   Medication Sig Start Date End Date Taking? Authorizing Provider  chlorhexidine (PERIDEX) 0.12 % solution Use as directed 15 mLs in the mouth or throat 2 (two) times daily. 03/05/17   Elvina Sidle, MD  dicyclomine (BENTYL) 20 MG tablet Take 1 tablet (20 mg total) by mouth  3 (three) times daily before meals for 10 days. 11/27/17 12/07/17  Shaune Pollack, MD  dicyclomine (BENTYL) 20 MG tablet Take 1 tablet (20 mg total) by mouth 3 (three) times daily as needed for spasms. 12/14/17   Benjiman Core, MD  oxyCODONE-acetaminophen (PERCOCET/ROXICET) 5-325 MG tablet Take 1-2 tablets by mouth every 6 (six) hours as needed for moderate pain or severe pain. 11/27/17   Shaune Pollack, MD  raNITIdine HCl (ZANTAC PO) Take by mouth.    [provider]  sulfamethoxazole-trimethoprim (BACTRIM DS,SEPTRA DS) 800-160 MG tablet Take 1 tablet by mouth 2 (two) times daily. 11/17/17   Aviva Signs, CNM  venlafaxine XR (EFFEXOR-XR) 150 MG 24 hr capsule TAKE 1 CAPSULE BY MOUTH EVERY DAY 10/02/17   [provider]    Family History No family history on file.  Social History Social History   Tobacco Use  . Smoking status: Never Smoker  . Smokeless tobacco: Never Used  Substance Use Topics  . Alcohol use: Yes    Comment: occasionally  . Drug use: No  Allergies   Patient has no known allergies.   Review of Systems Review of Systems  Constitutional: Positive for appetite change and unexpected weight change.  Respiratory: Negative for shortness of breath.   Cardiovascular: Negative for chest pain.  Gastrointestinal: Positive for abdominal pain and nausea.  Genitourinary: Negative for vaginal bleeding and vaginal discharge.  Musculoskeletal: Positive for back pain.  Skin: Negative for rash.  Neurological: Negative for seizures and weakness.  Psychiatric/Behavioral: Negative for confusion.       Has history of depression and her mood has not been as good recently.     Physical Exam Updated Vital Signs BP 117/76   Pulse 85   Temp 98.8 F (37.1 C) (Oral)   Resp 18   Ht 5\' 3"  (1.6 m)   Wt 58.5 kg   LMP 11/19/2017   SpO2 100%   BMI 22.83 kg/m   Physical Exam  Constitutional: She appears well-developed.  HENT:  Head: Normocephalic.    Cardiovascular: Regular rhythm.  Abdominal: Normal appearance. There is no tenderness.  Neurological: She is alert.  Skin: Skin is warm. Capillary refill takes less than 2 seconds.     ED Treatments / Results  Labs (all labs ordered are listed, but only abnormal results are displayed) Labs Reviewed  URINALYSIS, ROUTINE W REFLEX MICROSCOPIC - Abnormal; Notable for the following components:      Result Value   Leukocytes, UA SMALL (*)    All other components within normal limits  URINALYSIS, MICROSCOPIC (REFLEX) - Abnormal; Notable for the following components:   Bacteria, UA RARE (*)    All other components within normal limits  PREGNANCY, URINE  TSH    EKG None  Radiology US Abdomen Complete  Result Date: 12/14/2017 CLINICAL DATA:  Right upper quadrant/upper back pain and nausea. EXAM: ABDOMEN ULTRASOUND COMPLETE COMPARISON:  Body CT 11/27/2017 FINDINGS: Gallbladder: Minimal amount of sludge. Two less than 2 mm gallbladder polyps. No gallstones or wall thickening visualized. No sonographic Murphy sign noted by sonographer. Common bile duct: Diameter: 1 mm Liver: No focal lesion identified. Within normal limits in parenchymal echogenicity. Portal vein is patent on color Doppler imaging with normal direction of blood flow towards the liver. IVC: No abnormality visualized. Pancreas: Visualized portion unremarkable. Spleen: Size and appearance within normal limits. Right Kidney: Length: 10.0 cm. Echogenicity within normal limits. No mass or hydronephrosis visualized. Left Kidney: Length: 10.5 cm. Echogenicity within normal limits. No mass or hydronephrosis visualized. Abdominal aorta: No aneurysm visualized. Other findings: None. IMPRESSION: Small amount of gallbladder sludge. 2 less than 2 mm gallbladder polyps. Follow-up in 12 months may be considered. No evidence of acute cholecystitis. Otherwise normal abdominal ultrasound. Electronically Signed   By: Ted Mcalpine M.D.   On:  12/14/2017 18:40    Procedures Procedures (including critical care time)  Medications Ordered in ED Medications  ketorolac (TORADOL) 30 MG/ML injection 15 mg (15 mg Intramuscular Given 12/14/17 1957)  dicyclomine (BENTYL) capsule 10 mg (10 mg Oral Given 12/14/17 1957)     Initial Impression / Assessment and Plan / ED Course  I have reviewed the triage vital signs and the nursing notes.  Pertinent labs & imaging results that were available during my care of the patient were reviewed by me and considered in my medical decision making (see chart for details).     Patient with what is now tending to be a chronic abdominal pain.  Is had for the last 4 months.  Has had somewhat extensive work-up without  a clear cause.  Recent colonoscopy but states she did not actually see [facility] that is to the colonoscopy.  Ultrasound done since it is now right upper quadrant.  May have a small amount of sludge without other findings of cholecystitis.  Lab work reviewed from prior and not repeated today.  Rather benign exam.  Bentyl given for symptoms.  Follow-up with [facility].  Final Clinical Impressions(s) / ED Diagnoses   Final diagnoses:  Generalized abdominal pain    ED Discharge Orders         Ordered    dicyclomine (BENTYL) 20 MG tablet  3 times daily PRN     12/14/17 2008           Benjiman Core, MD 12/14/17 2337

## 2017-12-15 DIAGNOSIS — D126 Benign neoplasm of colon, unspecified: Secondary | ICD-10-CM | POA: Diagnosis not present

## 2017-12-15 DIAGNOSIS — R109 Unspecified abdominal pain: Secondary | ICD-10-CM | POA: Diagnosis not present

## 2017-12-21 DIAGNOSIS — H04123 Dry eye syndrome of bilateral lacrimal glands: Secondary | ICD-10-CM | POA: Diagnosis not present

## 2017-12-21 DIAGNOSIS — H01021 Squamous blepharitis right upper eyelid: Secondary | ICD-10-CM | POA: Diagnosis not present

## 2017-12-21 DIAGNOSIS — H01022 Squamous blepharitis right lower eyelid: Secondary | ICD-10-CM | POA: Diagnosis not present

## 2017-12-21 DIAGNOSIS — K824 Cholesterolosis of gallbladder: Secondary | ICD-10-CM | POA: Diagnosis not present

## 2017-12-21 DIAGNOSIS — R1011 Right upper quadrant pain: Secondary | ICD-10-CM | POA: Diagnosis not present

## 2017-12-21 DIAGNOSIS — F429 Obsessive-compulsive disorder, unspecified: Secondary | ICD-10-CM | POA: Diagnosis not present

## 2017-12-21 DIAGNOSIS — H5310 Unspecified subjective visual disturbances: Secondary | ICD-10-CM | POA: Diagnosis not present

## 2017-12-22 ENCOUNTER — Other Ambulatory Visit (HOSPITAL_COMMUNITY): Payer: Self-pay | Admitting: Physician Assistant

## 2017-12-22 DIAGNOSIS — R1084 Generalized abdominal pain: Secondary | ICD-10-CM | POA: Diagnosis not present

## 2017-12-22 DIAGNOSIS — R110 Nausea: Secondary | ICD-10-CM

## 2017-12-22 DIAGNOSIS — R1011 Right upper quadrant pain: Secondary | ICD-10-CM

## 2017-12-29 ENCOUNTER — Encounter (HOSPITAL_COMMUNITY)
Admission: RE | Admit: 2017-12-29 | Discharge: 2017-12-29 | Disposition: A | Discharge status: Home / Self Care | Payer: BLUE CROSS/BLUE SHIELD | Source: Ambulatory Visit | Attending: Physician Assistant | Admitting: Physician Assistant

## 2017-12-29 DIAGNOSIS — R1011 Right upper quadrant pain: Secondary | ICD-10-CM | POA: Diagnosis not present

## 2017-12-29 DIAGNOSIS — R110 Nausea: Secondary | ICD-10-CM | POA: Insufficient documentation

## 2017-12-29 MED ORDER — TECHNETIUM TC 99M MEBROFENIN IV KIT
5.2000 | PACK | Freq: Once | INTRAVENOUS | Status: AC | PRN
  Administered 2017-12-29: 5.2 via INTRAVENOUS

## 2018-01-05 DIAGNOSIS — K824 Cholesterolosis of gallbladder: Secondary | ICD-10-CM | POA: Diagnosis not present

## 2018-01-05 DIAGNOSIS — R350 Frequency of micturition: Secondary | ICD-10-CM | POA: Diagnosis not present

## 2018-01-05 DIAGNOSIS — R1084 Generalized abdominal pain: Secondary | ICD-10-CM | POA: Diagnosis not present

## 2018-01-08 DIAGNOSIS — F411 Generalized anxiety disorder: Secondary | ICD-10-CM | POA: Diagnosis not present

## 2018-01-08 DIAGNOSIS — F429 Obsessive-compulsive disorder, unspecified: Secondary | ICD-10-CM | POA: Diagnosis not present

## 2018-08-27 DIAGNOSIS — F3342 Major depressive disorder, recurrent, in full remission: Secondary | ICD-10-CM | POA: Diagnosis not present

## 2018-11-26 ENCOUNTER — Emergency Department (HOSPITAL_COMMUNITY)
Admission: EM | Admit: 2018-11-26 | Discharge: 2018-11-27 | Disposition: A | Discharge status: Other Acute Inpatient Hospital | Payer: BLUE CROSS/BLUE SHIELD | Source: Home / Self Care | Attending: Emergency Medicine | Admitting: Emergency Medicine

## 2018-11-26 ENCOUNTER — Encounter (HOSPITAL_COMMUNITY): Payer: Self-pay | Admitting: Emergency Medicine

## 2018-11-26 DIAGNOSIS — Z915 Personal history of self-harm: Secondary | ICD-10-CM | POA: Diagnosis not present

## 2018-11-26 DIAGNOSIS — Z818 Family history of other mental and behavioral disorders: Secondary | ICD-10-CM | POA: Diagnosis not present

## 2018-11-26 DIAGNOSIS — T50902A Poisoning by unspecified drugs, medicaments and biological substances, intentional self-harm, initial encounter: Secondary | ICD-10-CM | POA: Insufficient documentation

## 2018-11-26 DIAGNOSIS — Z79899 Other long term (current) drug therapy: Secondary | ICD-10-CM | POA: Insufficient documentation

## 2018-11-26 DIAGNOSIS — F1020 Alcohol dependence, uncomplicated: Secondary | ICD-10-CM | POA: Insufficient documentation

## 2018-11-26 DIAGNOSIS — T450X2A Poisoning by antiallergic and antiemetic drugs, intentional self-harm, initial encounter: Secondary | ICD-10-CM | POA: Diagnosis not present

## 2018-11-26 DIAGNOSIS — Z03818 Encounter for observation for suspected exposure to other biological agents ruled out: Secondary | ICD-10-CM | POA: Diagnosis not present

## 2018-11-26 DIAGNOSIS — Z20828 Contact with and (suspected) exposure to other viral communicable diseases: Secondary | ICD-10-CM | POA: Insufficient documentation

## 2018-11-26 DIAGNOSIS — R45851 Suicidal ideations: Secondary | ICD-10-CM | POA: Diagnosis not present

## 2018-11-26 DIAGNOSIS — T485X2A Poisoning by other anti-common-cold drugs, intentional self-harm, initial encounter: Secondary | ICD-10-CM | POA: Diagnosis not present

## 2018-11-26 DIAGNOSIS — F419 Anxiety disorder, unspecified: Secondary | ICD-10-CM | POA: Diagnosis not present

## 2018-11-26 DIAGNOSIS — G4700 Insomnia, unspecified: Secondary | ICD-10-CM | POA: Diagnosis not present

## 2018-11-26 DIAGNOSIS — F332 Major depressive disorder, recurrent severe without psychotic features: Secondary | ICD-10-CM | POA: Insufficient documentation

## 2018-11-26 DIAGNOSIS — R000 Tachycardia, unspecified: Secondary | ICD-10-CM | POA: Diagnosis not present

## 2018-11-26 LAB — PROTIME-INR
INR: 1 (ref 0.8–1.2)
Prothrombin Time: 12.6 seconds (ref 11.4–15.2)

## 2018-11-26 LAB — CBC WITH DIFFERENTIAL/PLATELET
Abs Immature Granulocytes: 0.01 10*3/uL (ref 0.00–0.07)
Basophils Absolute: 0.1 10*3/uL (ref 0.0–0.1)
Basophils Relative: 1 %
Eosinophils Absolute: 0.3 10*3/uL (ref 0.0–0.5)
Eosinophils Relative: 3 %
HCT: 38.6 % (ref 36.0–46.0)
Hemoglobin: 11.6 g/dL — ABNORMAL LOW (ref 12.0–15.0)
Immature Granulocytes: 0 %
Lymphocytes Relative: 35 %
Lymphs Abs: 2.6 10*3/uL (ref 0.7–4.0)
MCH: 22.1 pg — ABNORMAL LOW (ref 26.0–34.0)
MCHC: 30.1 g/dL (ref 30.0–36.0)
MCV: 73.7 fL — ABNORMAL LOW (ref 80.0–100.0)
Monocytes Absolute: 0.7 10*3/uL (ref 0.1–1.0)
Monocytes Relative: 9 %
Neutro Abs: 3.8 10*3/uL (ref 1.7–7.7)
Neutrophils Relative %: 52 %
Platelets: 349 10*3/uL (ref 150–400)
RBC: 5.24 MIL/uL — ABNORMAL HIGH (ref 3.87–5.11)
RDW: 16.2 % — ABNORMAL HIGH (ref 11.5–15.5)
WBC: 7.4 10*3/uL (ref 4.0–10.5)
nRBC: 0 % (ref 0.0–0.2)

## 2018-11-26 LAB — HEPATIC FUNCTION PANEL
ALT: 18 U/L (ref 0–44)
AST: 23 U/L (ref 15–41)
Albumin: 4.3 g/dL (ref 3.5–5.0)
Alkaline Phosphatase: 59 U/L (ref 38–126)
Bilirubin, Direct: <0.1 mg/dL (ref 0.0–0.2)
Total Bilirubin: 0.1 mg/dL — ABNORMAL LOW (ref 0.3–1.2)
Total Protein: 6.9 g/dL (ref 6.5–8.1)

## 2018-11-26 LAB — BASIC METABOLIC PANEL
Anion gap: 13 (ref 5–15)
BUN: 9 mg/dL (ref 6–20)
CO2: 24 mmol/L (ref 22–32)
Calcium: 9.1 mg/dL (ref 8.9–10.3)
Chloride: 104 mmol/L (ref 98–111)
Creatinine, Ser: 0.84 mg/dL (ref 0.44–1.00)
GFR calc Af Amer: >60 mL/min (ref >60)
GFR calc non Af Amer: >60 mL/min (ref >60)
Glucose, Bld: 92 mg/dL (ref 70–99)
Potassium: 3.6 mmol/L (ref 3.5–5.1)
Sodium: 141 mmol/L (ref 135–145)

## 2018-11-26 LAB — RAPID URINE DRUG SCREEN, HOSP PERFORMED
Amphetamines: NOT DETECTED
Barbiturates: NOT DETECTED
Benzodiazepines: NOT DETECTED
Cocaine: NOT DETECTED
Opiates: NOT DETECTED
Tetrahydrocannabinol: NOT DETECTED

## 2018-11-26 LAB — SALICYLATE LEVEL: Salicylate Lvl: <7 mg/dL (ref 2.8–30.0)

## 2018-11-26 LAB — ACETAMINOPHEN LEVEL
Acetaminophen (Tylenol), Serum: 101 ug/mL — ABNORMAL HIGH (ref 10–30)
Acetaminophen (Tylenol), Serum: 79 ug/mL — ABNORMAL HIGH (ref 10–30)

## 2018-11-26 LAB — ETHANOL: Alcohol, Ethyl (B): 204 mg/dL — ABNORMAL HIGH (ref <10)

## 2018-11-26 MED ORDER — VENLAFAXINE HCL ER 150 MG PO CP24
150.0000 mg | ORAL_CAPSULE | Freq: Every day | ORAL | Status: DC
  Administered 2018-11-26 – 2018-11-27 (×2): 150 mg via ORAL
  Filled 2018-11-26 (×2): qty 1

## 2018-11-26 MED ORDER — SODIUM CHLORIDE 0.9 % IV BOLUS
1000.0000 mL | Freq: Once | INTRAVENOUS | Status: AC
  Administered 2018-11-26: 1000 mL via INTRAVENOUS

## 2018-11-26 NOTE — ED Triage Notes (Signed)
Pt arrives with her boyfriend from home with a c/o of overdose- per boyfriend pt drank 6-7 seltzers with her boyfriend and then she got into an argument with her boyfriend and went into the bathroom and after coming out her boyfriend seen approx. 3 empty packages on benadryl tablets (2 per package) &  6 empty Nyquil packages- their was also a bottle of Tylenol on the counter but he is not sure she took any of those- "it was a big bottle but there was still a lot of pills in there" per boyfriend. Pt is awake to voice but not responding appropriately- pt no able to or unwilling to answer any questions.

## 2018-11-26 NOTE — ED Provider Notes (Signed)
[facility] EMERGENCY DEPARTMENT Provider Note   CSN: [id number] Arrival date & time: 11/26/18  0434     History   Chief Complaint Chief Complaint  Patient presents with  . Drug Overdose    HPI Diana Gomez is a 28 y.o. female.     Patient presents to the emergency department for evaluation after overdose.  Patient is accompanied by her boyfriend who gives history.  Patient took an intentional overdose 1 hour before arrival (Ingestion at 03:30AM) in the ER.  She had been drinking alcohol.  Boyfriend reports that they did have a fight which caused her to start drinking and then she disappeared into the bathroom.  When he got in the bathroom he discovered she had taken handfuls of Benadryl, NyQuil and Tylenol.  Patient somnolent at arrival, cannot provide additional information. Level V Caveat due to mental status change.     History reviewed. No pertinent past medical history.  There are no active problems to display for this patient.   History reviewed. No pertinent surgical history.   OB History    Gravida  1   Para      Term      Preterm      AB  1   Living        SAB      TAB  1   Ectopic      Multiple      Live Births               Home Medications    Prior to Admission medications   Medication Sig Start Date End Date Taking? Authorizing Provider  chlorhexidine (PERIDEX) 0.12 % solution Use as directed 15 mLs in the mouth or throat 2 (two) times daily. 03/05/17   Robyn Haber, MD  dicyclomine (BENTYL) 20 MG tablet Take 1 tablet (20 mg total) by mouth 3 (three) times daily before meals for 10 days. 11/27/17 12/07/17  Duffy Bruce, MD  dicyclomine (BENTYL) 20 MG tablet Take 1 tablet (20 mg total) by mouth 3 (three) times daily as needed for spasms. 12/14/17   Davonna Belling, MD  oxyCODONE-acetaminophen (PERCOCET/ROXICET) 5-325 MG tablet Take 1-2 tablets by mouth every 6 (six) hours as needed for moderate pain or  severe pain. 11/27/17   Duffy Bruce, MD  raNITIdine HCl (ZANTAC PO) Take by mouth.    [provider]  sulfamethoxazole-trimethoprim (BACTRIM DS,SEPTRA DS) 800-160 MG tablet Take 1 tablet by mouth 2 (two) times daily. 11/17/17   Seabron Spates, CNM  venlafaxine XR (EFFEXOR-XR) 150 MG 24 hr capsule TAKE 1 CAPSULE BY MOUTH EVERY DAY 10/02/17   [provider]    Family History No family history on file.  Social History Social History   Tobacco Use  . Smoking status: Never Smoker  . Smokeless tobacco: Never Used  Substance Use Topics  . Alcohol use: Yes    Comment: occasionally  . Drug use: No     Allergies   Patient has no known allergies.   Review of Systems Review of Systems  Unable to perform ROS: Mental status change     Physical Exam Updated Vital Signs BP 132/80   Pulse (!) 132   Temp 98.6 F (37 C) (Oral)   Resp 20   SpO2 100%   Physical Exam Vitals signs and nursing note reviewed.  Constitutional:      General: She is not in acute distress.    Appearance: Normal appearance. She is  well-developed.  HENT:     Head: Normocephalic and atraumatic.     Right Ear: Hearing normal.     Left Ear: Hearing normal.     Nose: Nose normal.  Eyes:     Conjunctiva/sclera: Conjunctivae normal.     Pupils: Pupils are equal, round, and reactive to light.  Neck:     Musculoskeletal: Normal range of motion and neck supple.  Cardiovascular:     Rate and Rhythm: Regular rhythm.     Heart sounds: S1 normal and S2 normal. No murmur. No friction rub. No gallop.   Pulmonary:     Effort: Pulmonary effort is normal. No respiratory distress.     Breath sounds: Normal breath sounds.  Chest:     Chest wall: No tenderness.  Abdominal:     General: Bowel sounds are normal.     Palpations: Abdomen is soft.     Tenderness: There is no abdominal tenderness. There is no guarding or rebound. Negative signs include Murphy's sign and McBurney's sign.     Hernia:  No hernia is present.  Musculoskeletal: Normal range of motion.  Skin:    General: Skin is warm and dry.     Findings: No rash.  Neurological:     Mental Status: She is alert.     GCS: GCS eye subscore is 4. GCS verbal subscore is 4. GCS motor subscore is 6.     Cranial Nerves: No cranial nerve deficit.     Sensory: No sensory deficit.     Coordination: Coordination normal.  Psychiatric:        Attention and Perception: She is inattentive.        Mood and Affect: Mood is depressed.        Speech: Speech is slurred.      ED Treatments / Results  Labs (all labs ordered are listed, but only abnormal results are displayed) Labs Reviewed  CBC WITH DIFFERENTIAL/PLATELET - Abnormal; Notable for the following components:      Result Value   RBC 5.24 (*)    Hemoglobin 11.6 (*)    MCV 73.7 (*)    MCH 22.1 (*)    RDW 16.2 (*)    All other components within normal limits  HEPATIC FUNCTION PANEL - Abnormal; Notable for the following components:   Total Bilirubin 0.1 (*)    All other components within normal limits  ETHANOL - Abnormal; Notable for the following components:   Alcohol, Ethyl (B) 204 (*)    All other components within normal limits  ACETAMINOPHEN LEVEL - Abnormal; Notable for the following components:   Acetaminophen (Tylenol), Serum 101 (*)    All other components within normal limits  BASIC METABOLIC PANEL  PROTIME-INR  RAPID URINE DRUG SCREEN, HOSP PERFORMED  SALICYLATE LEVEL  ACETAMINOPHEN LEVEL    EKG EKG Interpretation  Date/Time:  Monday November 26 2018 04:46:08 EDT Ventricular Rate:  109 PR Interval:    QRS Duration: 83 QT Interval:  333 QTC Calculation: 449 R Axis:   75 Text Interpretation:  Sinus tachycardia Atrial premature complex Borderline repolarization abnormality Confirmed by Geoffery LyonseLo, Douglas ([id number]) on 11/26/2018 4:49:44 AM   Radiology No results found.  Procedures Procedures (including critical care time)  Medications Ordered in ED  Medications  sodium chloride 0.9 % bolus 1,000 mL (0 mLs Intravenous Stopped 11/26/18 0630)     Initial Impression / Assessment and Plan / ED Course  I have reviewed the triage vital signs and the nursing notes.  Pertinent labs & imaging results that were available during my care of the patient were reviewed by me and considered in my medical decision making (see chart for details).        Patient presents to the emergency department for evaluation of overdose.  Patient had a fight with her boyfriend and then started drinking heavily.  He reports that she locked herself in the bathroom and then took multiple Tylenol and Benadryl tablets as well as some NyQuil with unknown components.  Patient altered at arrival.  Initial Tylenol level 101 but that was at approximately an hour to 1-1/2 hours after ingestion.  Will check 4-hour postingestion level to determine if she needs Acetadote.  Will sign out to oncoming ER physician to follow-up on that level and then disposition patient.  Final Clinical Impressions(s) / ED Diagnoses   Final diagnoses:  Intentional drug overdose, initial encounter [facility])    ED Discharge Orders    None       Gilda Crease, MD 11/26/18 [phone]

## 2018-11-26 NOTE — ED Notes (Signed)
Regular dinner tray ordered 
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
## 2018-11-26 NOTE — ED Triage Notes (Signed)
TC from [place] from poison control.

## 2018-11-26 NOTE — ED Notes (Signed)
Pt more alert now asking to use restroom. Pt was unable to use bed pan or bed side commode so I took pt to RR in wheelchair and had OP of 1000 cc.

## 2018-11-26 NOTE — BHH Counselor (Signed)
Disposition: Mordecai Maes, NP recommends in patient treatment. [place] to review after discharges.

## 2018-11-26 NOTE — ED Provider Notes (Signed)
Signed out by Dr Betsey Holiday that 4 hour acetaminophen level is pending, and that [facility] eval is pending.  4 hour level 79, not in toxic range requiring tx.   Patient is drowsy, but easily arousable. Hr is currently 94, bp is normal. Pulse ox 100%.   BH eval is pending.      Lajean Saver, MD 11/26/18 [phone]

## 2018-11-26 NOTE — ED Notes (Addendum)
Poison Control Called: Monitor EKG, 4 hr Acetaminophen check at 0800. Monitor for Bowel sounds & urinary retention.

## 2018-11-26 NOTE — ED Notes (Addendum)
Pt very tearful laying in bed. Given blankets and dinner tray. Sitter no longer available for pt

## 2018-11-26 NOTE — ED Notes (Signed)
Pt taken to RR in wheelchair. Was able to stand and ambulate to toilet with steady gait.

## 2018-11-26 NOTE — ED Triage Notes (Signed)
Pt awake and alert sitting up in bed. Pt reports she just can not stay. Pt reports I just can not say here. Pt is aware IVC papers are current and she can not leave.

## 2018-11-26 NOTE — BHH Counselor (Addendum)
TTS attempting to reach RN to initiate tele-psych.  @11 :12 attempted to reach [facility] ED to initiate telepsych  @11 :35, spoke with Tiffany, RN who states she will get cart for tele-psych.

## 2018-11-26 NOTE — ED Notes (Signed)
Regular lunch tray ordered 

## 2018-11-27 ENCOUNTER — Encounter (HOSPITAL_COMMUNITY): Payer: Self-pay

## 2018-11-27 ENCOUNTER — Other Ambulatory Visit: Payer: Self-pay

## 2018-11-27 ENCOUNTER — Inpatient Hospital Stay (HOSPITAL_COMMUNITY)
Admission: AD | Admit: 2018-11-27 | Discharge: 2018-11-29 | DRG: 885 | Disposition: A | Discharge status: Home / Self Care | Payer: BLUE CROSS/BLUE SHIELD | Attending: Psychiatry | Admitting: Psychiatry

## 2018-11-27 DIAGNOSIS — R45851 Suicidal ideations: Secondary | ICD-10-CM | POA: Diagnosis present

## 2018-11-27 DIAGNOSIS — Z20828 Contact with and (suspected) exposure to other viral communicable diseases: Secondary | ICD-10-CM | POA: Diagnosis present

## 2018-11-27 DIAGNOSIS — G4700 Insomnia, unspecified: Secondary | ICD-10-CM | POA: Diagnosis present

## 2018-11-27 DIAGNOSIS — F419 Anxiety disorder, unspecified: Secondary | ICD-10-CM | POA: Diagnosis present

## 2018-11-27 DIAGNOSIS — F332 Major depressive disorder, recurrent severe without psychotic features: Secondary | ICD-10-CM | POA: Diagnosis not present

## 2018-11-27 DIAGNOSIS — Z79899 Other long term (current) drug therapy: Secondary | ICD-10-CM | POA: Diagnosis not present

## 2018-11-27 DIAGNOSIS — Z818 Family history of other mental and behavioral disorders: Secondary | ICD-10-CM | POA: Diagnosis not present

## 2018-11-27 DIAGNOSIS — Z915 Personal history of self-harm: Secondary | ICD-10-CM | POA: Diagnosis not present

## 2018-11-27 DIAGNOSIS — F1020 Alcohol dependence, uncomplicated: Secondary | ICD-10-CM | POA: Diagnosis not present

## 2018-11-27 HISTORY — DX: Depression, unspecified: F32.A

## 2018-11-27 HISTORY — DX: Anxiety disorder, unspecified: F41.9

## 2018-11-27 LAB — SARS CORONAVIRUS 2 (TAT 6-24 HRS): SARS Coronavirus 2: NEGATIVE

## 2018-11-27 MED ORDER — VENLAFAXINE HCL ER 150 MG PO CP24
150.0000 mg | ORAL_CAPSULE | Freq: Every day | ORAL | Status: DC
  Administered 2018-11-28 – 2018-11-29 (×2): 150 mg via ORAL
  Filled 2018-11-27 (×5): qty 1

## 2018-11-27 MED ORDER — ALUM & MAG HYDROXIDE-SIMETH 200-200-20 MG/5ML PO SUSP: 30.0000 mL | ORAL | Status: DC | PRN

## 2018-11-27 MED ORDER — VITAMIN B-1 100 MG PO TABS
100.0000 mg | ORAL_TABLET | Freq: Every day | ORAL | Status: DC
  Administered 2018-11-27 – 2018-11-29 (×3): 100 mg via ORAL
  Filled 2018-11-27 (×7): qty 1

## 2018-11-27 MED ORDER — HYDROXYZINE HCL 25 MG PO TABS
25.0000 mg | ORAL_TABLET | Freq: Three times a day (TID) | ORAL | Status: DC | PRN
  Administered 2018-11-28: 25 mg via ORAL
  Filled 2018-11-27: qty 1

## 2018-11-27 MED ORDER — FOLIC ACID 1 MG PO TABS
1.0000 mg | ORAL_TABLET | Freq: Every day | ORAL | Status: DC
  Administered 2018-11-27 – 2018-11-29 (×3): 1 mg via ORAL
  Filled 2018-11-27 (×7): qty 1

## 2018-11-27 MED ORDER — TRAZODONE HCL 50 MG PO TABS
50.0000 mg | ORAL_TABLET | Freq: Every evening | ORAL | Status: DC | PRN
  Administered 2018-11-27 – 2018-11-28 (×2): 50 mg via ORAL
  Filled 2018-11-27 (×2): qty 1

## 2018-11-27 MED ORDER — ACETAMINOPHEN 325 MG PO TABS: 650.0000 mg | ORAL_TABLET | Freq: Four times a day (QID) | ORAL | Status: DC | PRN

## 2018-11-27 MED ORDER — LORAZEPAM 1 MG PO TABS: 1.0000 mg | ORAL_TABLET | Freq: Four times a day (QID) | ORAL | Status: DC | PRN

## 2018-11-27 MED ORDER — MAGNESIUM HYDROXIDE 400 MG/5ML PO SUSP: 30.0000 mL | Freq: Every day | ORAL | Status: DC | PRN

## 2018-11-27 NOTE — ED Notes (Signed)
Per Cytology, COVID-19 test should result around 1300.

## 2018-11-27 NOTE — ED Notes (Signed)
Pt has been accepted at [facility] and can arrive at any time *after* her COVID test results return and they are called in to the [facility] unit. This information was provided to pt's nurse, Caprice Kluver, RN at [phone].  Room: 303-1 Accepting: Mordecai Maes, NP Attending: Dr. Parke Poisson Call to Report: [phone]

## 2018-11-27 NOTE — Tx Team (Signed)
Initial Treatment Plan 11/27/2018 3:34 PM Diana Gomez KZL:[id number]    PATIENT STRESSORS: Financial difficulties Substance abuse   PATIENT STRENGTHS: Ability for insight Average or above average intelligence Capable of independent living General fund of knowledge Motivation for treatment/growth Physical Health Supportive family/friends   PATIENT IDENTIFIED PROBLEMS: Suicide attempt  Binge drinking  anxiety  depression               DISCHARGE CRITERIA:  Ability to meet basic life and health needs Improved stabilization in mood, thinking, and/or behavior Motivation to continue treatment in a less acute level of care Reduction of life-threatening or endangering symptoms to within safe limits  PRELIMINARY DISCHARGE PLAN: Attend 12-step recovery group Return to previous living arrangement Return to previous work or school arrangements  PATIENT/FAMILY INVOLVEMENT: This treatment plan has been presented to and reviewed with the patient, Diana Gomez.  The patient and family have been given the opportunity to ask questions and make suggestions.  Baron Sane, RN 11/27/2018, 3:34 PM

## 2018-11-27 NOTE — ED Notes (Signed)
Family at bedside. 

## 2018-11-27 NOTE — ED Notes (Signed)
Pt belongings (clothing) given to [employer]

## 2018-11-27 NOTE — BHH Group Notes (Signed)
Pt did not attend wrap up group this evening. Pt was in bed.  
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
## 2018-11-27 NOTE — ED Notes (Signed)
Called [facility] to report the results of Covid test.   [employer] called for transport.

## 2018-11-27 NOTE — ED Notes (Signed)
Lab advised COVID-19 may result in a couple more hours.

## 2018-11-27 NOTE — BH Assessment (Addendum)
Pt has been accepted at [facility] and can arrive at any time *after* her COVID test results return and they are called in to the BHH unit. This information was provided to pt's nurse, Shawnette, RN at 0236.  Room: 303-1 Accepting: Lashunda Thomas, NP Attending: Dr. Cobos Call to Report: [phone] 

## 2018-11-27 NOTE — ED Notes (Addendum)
ED TO INPATIENT HANDOFF REPORT  ED Nurse Name and Phone #:   S Name/Age/Gender Tennis Ship 28 y.o. female Room/Bed: H015C/H015C  Code Status   Code Status: Full Code  Home/SNF/Other Home Patient oriented to: self, place, time and situation Is this baseline? Yes   Triage Complete: Triage complete  Chief Complaint overdose  Triage Note Pt arrives with her boyfriend from home with a c/o of overdose- per boyfriend pt drank 6-7 seltzers with her boyfriend and then she got into an argument with her boyfriend and went into the bathroom and after coming out her boyfriend seen approx. 3 empty packages on benadryl tablets (2 per package) &  6 empty Nyquil packages- their was also a bottle of Tylenol on the counter but he is not sure she took any of those- "it was a big bottle but there was still a lot of pills in there" per boyfriend. Pt is awake to voice but not responding appropriately- pt no able to or unwilling to answer any questions.     TC from [place] from poison control.  Pt awake and alert sitting up in bed. Pt reports she just can not stay. Pt reports I just can not say here. Pt is aware IVC papers are current and she can not leave.   Allergies No Known Allergies  Level of Care/Admitting Diagnosis ED Disposition    None      B Medical/Surgery History History reviewed. No pertinent past medical history. History reviewed. No pertinent surgical history.   A IV Location/Drains/Wounds Patient Lines/Drains/Airways Status   Active Line/Drains/Airways    Name:   Placement date:   Placement time:   Site:   Days:   Peripheral IV 11/26/18 Left Hand   11/26/18    0458    Hand   1   Peripheral IV 11/26/18 Right Antecubital   11/26/18    0458    Antecubital   1          Intake/Output Last 24 hours  Intake/Output Summary (Last 24 hours) at 11/27/2018 0438 Last data filed at 11/26/2018 0630 Gross per 24 hour  Intake 1000 ml  Output -  Net 1000 ml     Labs/Imaging Results for orders placed or performed during the hospital encounter of 11/26/18 (from the past 48 hour(s))  Rapid urine drug screen (hospital performed)     Status: None   Collection Time: 11/26/18  4:45 AM  Result Value Ref Range   Opiates NONE DETECTED NONE DETECTED   Cocaine NONE DETECTED NONE DETECTED   Benzodiazepines NONE DETECTED NONE DETECTED   Amphetamines NONE DETECTED NONE DETECTED   Tetrahydrocannabinol NONE DETECTED NONE DETECTED   Barbiturates NONE DETECTED NONE DETECTED    Comment: (NOTE) DRUG SCREEN FOR MEDICAL PURPOSES ONLY.  IF CONFIRMATION IS NEEDED FOR ANY PURPOSE, NOTIFY LAB WITHIN 5 DAYS. LOWEST DETECTABLE LIMITS FOR URINE DRUG SCREEN Drug Class                     Cutoff (ng/mL) Amphetamine and metabolites    1000 Barbiturate and metabolites    200 Benzodiazepine                 417 Tricyclics and metabolites     300 Opiates and metabolites        300 Cocaine and metabolites        300 THC  50 Performed at [facility], [address] [place], [place] [address]   CBC with Differential/Platelet     Status: Abnormal   Collection Time: 11/26/18  4:49 AM  Result Value Ref Range   WBC 7.4 4.0 - 10.5 K/uL   RBC 5.24 (H) 3.87 - 5.11 MIL/uL   Hemoglobin 11.6 (L) 12.0 - 15.0 g/dL   HCT 60.438.6 54.036.0 - 98.146.0 %   MCV 73.7 (L) 80.0 - 100.0 fL   MCH 22.1 (L) 26.0 - 34.0 pg   MCHC 30.1 30.0 - 36.0 g/dL   RDW 19.116.2 (H) 47.811.5 - 29.515.5 %   Platelets 349 150 - 400 K/uL   nRBC 0.0 0.0 - 0.2 %   Neutrophils Relative % 52 %   Neutro Abs 3.8 1.7 - 7.7 K/uL   Lymphocytes Relative 35 %   Lymphs Abs 2.6 0.7 - 4.0 K/uL   Monocytes Relative 9 %   Monocytes Absolute 0.7 0.1 - 1.0 K/uL   Eosinophils Relative 3 %   Eosinophils Absolute 0.3 0.0 - 0.5 K/uL   Basophils Relative 1 %   Basophils Absolute 0.1 0.0 - 0.1 K/uL   Immature Granulocytes 0 %   Abs Immature Granulocytes 0.01 0.00 - 0.07 K/uL    Comment: Performed at [facility], [address] [place], [place] [address]  Basic metabolic panel     Status: None   Collection Time: 11/26/18  4:49 AM  Result Value Ref Range   Sodium 141 135 - 145 mmol/L   Potassium 3.6 3.5 - 5.1 mmol/L   Chloride 104 98 - 111 mmol/L   CO2 24 22 - 32 mmol/L   Glucose, Bld 92 70 - 99 mg/dL   BUN 9 6 - 20 mg/dL   Creatinine, Ser 8.650.84 0.44 - 1.00 mg/dL   Calcium 9.1 8.9 - 78.410.3 mg/dL   GFR calc non Af Amer >60 >60 mL/min   GFR calc Af Amer >60 >60 mL/min   Anion gap 13 5 - 15    Comment: Performed at [facility], [address] [place], [place] [address]  Hepatic function panel     Status: Abnormal   Collection Time: 11/26/18  4:49 AM  Result Value Ref Range   Total Protein 6.9 6.5 - 8.1 g/dL   Albumin 4.3 3.5 - 5.0 g/dL   AST 23 15 - 41 U/L   ALT 18 0 - 44 U/L   Alkaline Phosphatase 59 38 - 126 U/L   Total Bilirubin 0.1 (L) 0.3 - 1.2 mg/dL   Bilirubin, Direct <5.2<0.1 0.0 - 0.2 mg/dL   Indirect Bilirubin NOT CALCULATED 0.3 - 0.9 mg/dL    Comment: Performed at [facility], [address] [place], [place] [address]  Protime-INR     Status: None   Collection Time: 11/26/18  4:49 AM  Result Value Ref Range   Prothrombin Time 12.6 11.4 - 15.2 seconds   INR 1.0 0.8 - 1.2    Comment: (NOTE) INR goal varies based on device and disease states. Performed at [facility], [address] [place], [place] [address]   Ethanol     Status: Abnormal   Collection Time: 11/26/18  4:49 AM  Result Value Ref Range   Alcohol, Ethyl (B) 204 (H) <10 mg/dL    Comment: (NOTE) Lowest detectable limit for serum alcohol is 10 mg/dL. For medical purposes only. Performed at [facility], [address]  [place], [place] [address]   Salicylate level     Status: None   Collection Time: 11/26/18  4:49 AM  Result Value Ref Range   Salicylate Lvl <7.0 2.8 - 30.0 mg/dL    Comment: Performed at [facility], [address] [place], [place] [address]   Acetaminophen level     Status: Abnormal   Collection Time: 11/26/18  4:49 AM  Result Value Ref Range   Acetaminophen (Tylenol), Serum 101 (H) 10 - 30 ug/mL    Comment: (NOTE) Therapeutic concentrations vary significantly. A range of 10-30 ug/mL  may be an effective concentration for many patients. However, some  are best treated at concentrations outside of this range. Acetaminophen concentrations >150 ug/mL at 4 hours after ingestion  and >50 ug/mL at 12 hours after ingestion are often associated with  toxic reactions. Performed at [facility], [address] [place], [place] [address]   Acetaminophen level     Status: Abnormal   Collection Time: 11/26/18  7:30 AM  Result Value Ref Range   Acetaminophen (Tylenol), Serum 79 (H) 10 - 30 ug/mL    Comment: (NOTE) Therapeutic concentrations vary significantly. A range of 10-30 ug/mL  may be an effective concentration for many patients. However, some  are best treated at concentrations outside of this range. Acetaminophen concentrations >150 ug/mL at 4 hours after ingestion  and >50 ug/mL at 12 hours after ingestion are often associated with  toxic reactions. Performed at [facility], [address] [place], [place] [address]    No results found.  Pending Labs [employer] (From admission, onward)    Start     Ordered   11/27/18 0305  SARS CORONAVIRUS 2 (TAT 6-24 HRS) Nasopharyngeal Nasopharyngeal Swab  (Asymptomatic/Tier 2)  Once,   STAT    Question Answer Comment  Is this test for diagnosis or screening Screening   Symptomatic for COVID-19 as defined by CDC No   Hospitalized for COVID-19 No   Admitted to ICU for COVID-19 No   Previously tested for COVID-19 No   Resident in a congregate (group) care setting No   Employed in healthcare setting No   Pregnant No      11/27/18 0305          Vitals/Pain Today's Vitals   11/26/18 1030 11/26/18 1045 11/26/18 1215 11/26/18 1538  BP: 118/85 122/84   127/79  Pulse: 96 (!) 102 (!) 108 87  Resp: (!) 21 (!) Temp:    98.5 F (36.9 C)  TempSrc:    Oral  SpO2: 99% 98% 95% 98%  PainSc:        Isolation Precautions No active isolations  Medications Medications  venlafaxine XR (EFFEXOR-XR) 24 hr capsule 150 mg (150 mg Oral Given 11/26/18 1727)  sodium chloride 0.9 % bolus 1,000 mL (0 mLs Intravenous Stopped 11/26/18 0630)    Mobility walks     Focused Assessments Cardiac Assessment Handoff:    No results found for: CKTOTAL, CKMB, CKMBINDEX, TROPONINI No results found for: DDIMER Does the Patient currently have chest pain? No      R Recommendations: See Admitting Provider Note  Report given to:   Additional Notes:  Patient is alert and oriented x 4. Her speech is soft, eye contact is good, and thoughts are organized. Patient's mood is depressed and her affect is congruent. Her insight, judgement, and impulse control are impaired. Patient does not appear to be responding to internal  stimuli or experiencing delusional thought content.  Patient reports ingesting a large amount of pill after drinking 7 alcoholic drinks in a suicide attempt. She states this is her 4th attempt. Patient reports that she has struggled with depression since she in middle school. She endorses symptoms of hopelessness, worthlessness, guilt, irritability, fatigue, loss of interest, social isolation, and poor concentration.

## 2018-11-27 NOTE — ED Notes (Signed)
Attempted report x1. 

## 2018-11-28 DIAGNOSIS — F332 Major depressive disorder, recurrent severe without psychotic features: Principal | ICD-10-CM

## 2018-11-28 DIAGNOSIS — F1020 Alcohol dependence, uncomplicated: Secondary | ICD-10-CM

## 2018-11-28 LAB — HCG, QUANTITATIVE, PREGNANCY: hCG, Beta Chain, Quant, S: 1 m[IU]/mL (ref <5)

## 2018-11-28 LAB — TSH: TSH: 3.214 u[IU]/mL (ref 0.350–4.500)

## 2018-11-28 NOTE — Plan of Care (Signed)
  Problem: Activity: Goal: Sleeping patterns will improve Outcome: Progressing Note: Patient is resting in bed with eyes closed no distress noted.

## 2018-11-28 NOTE — Progress Notes (Signed)
Recreation Therapy Notes  Date:  10.7.20 Time: 1120 Location: 300 Hall Dayroom  Group Topic: Stress Management  Goal Area(s) Addresses:  Patient will identify positive stress management techniques. Patient will identify benefits of using stress management post d/c.  Intervention:  Stress Management  Activity :  Meditation.  LRT introduced the stress management technique of meditation.  LRT played a meditation that focused on embodying the characteristics of mountain and bringing it into meditation and everyday life.  Patients were to follow along as meditation played.  Education:  Stress Management, Discharge Planning.   Education Outcome: Acknowledges Education  Clinical Observations/Feedback:  Pt did not attend group.    Lorane Cousar, LRT/CTRS         Elim Peale A 11/28/2018 12:03 PM 

## 2018-11-28 NOTE — H&P (Signed)
Psychiatric Admission Assessment Adult  Patient Identification: Diana Gomez MRN:  [id number] Date of Evaluation:  11/28/2018 Chief Complaint:  MDD RECURRENT SEVERE ETOH USE DISORDER MODERATE Principal Diagnosis: <principal problem not specified> Diagnosis:  Active Problems:   Severe recurrent major depression without psychotic features (HCC)  History of Present Illness: Patient is seen and examined.  Patient is a 28 year old female with a past psychiatric history significant of major depression as well as binge drinking disorder who presented to the [facility] emergency department on 11/26/2018 after an intentional overdose.  She had apparently ingested Benadryl, NyQuil and Tylenol after drinking alcohol.  The patient's boyfriend reported that they had a fight, and she began to drink.  She then disappeared into the bathroom.  Her overdose was approximately 1 hour prior to arrival in the emergency department.  Patient stated that she has had a history of depression for many years.  As a teenager she was treated with multiple antidepressants.  She was started on venlafaxine extended release in approximately 2014, and she stated that it had been beneficial.  She stated that what makes her mood worse is when she has her binge drinking episodes.  She stated she understands that the alcohol makes things much worse.  She reported in the emergency room that she had ingested the pills after drinking approximately 7 alcoholic drinks in a suicide attempt.  She admitted that this was her fourth suicide attempt.  She had no previous psychiatric admissions.  She did endorse symptoms of helplessness, worthlessness, guilt, irritability and fatigue.  She stated she is frustrated by her life.  She stated that she does not like her job, does not feel fulfilled, and is upset about where she is in life.  She was transferred to our hospital for evaluation and stabilization.  In the emergency department  her electrolytes and liver function enzymes were all within normal limits.  Her MCV was decreased at 73.7, her MCH was decreased to 22.1.  Her TSH was 3.214.  Her blood alcohol on admission was 204.  Drug screen was negative.  Her EKG on admission showed a sinus tachycardia with a normal QTC.  She denied any previous serious alcohol withdrawal symptoms.  She denied any seizures, tremors or hallucinations.  Her blood pressure stable, but she is mildly tachycardic with a rate of 110 this a.m.  She did sleep 6.75 hours last night.  Associated Signs/Symptoms: Depression Symptoms:  depressed mood, anhedonia, insomnia, psychomotor agitation, fatigue, feelings of worthlessness/guilt, difficulty concentrating, hopelessness, suicidal thoughts without plan, suicidal attempt, anxiety, loss of energy/fatigue, disturbed sleep, (Hypo) Manic Symptoms:  Impulsivity, Irritable Mood, Anxiety Symptoms:  Excessive Worry, Psychotic Symptoms:  denied PTSD Symptoms: Negative Total Time spent with patient: 45 minutes  Past Psychiatric History: Patient admitted that she had been treated with therapy as well as medication since a young age.  She has been on multiple antidepressants in the past.  She felt like Effexor was the most effective.  She admitted to 4 previous suicide attempts.  Is the patient at risk to self? Yes.    Has the patient been a risk to self in the past 6 months? Yes.    Has the patient been a risk to self within the distant past? No.  Is the patient a risk to others? No.  Has the patient been a risk to others in the past 6 months? No.  Has the patient been a risk to others within the distant past? No.  Prior Inpatient Therapy:   Prior Outpatient Therapy:    Alcohol Screening: 1. How often do you have a drink containing alcohol?: 2 to 3 times a week 2. How many drinks containing alcohol do you have on a typical day when you are drinking?: 7, 8, or 9 3. How often do you have six or  more drinks on one occasion?: Weekly AUDIT-C Score: 9 4. How often during the last year have you found that you were not able to stop drinking once you had started?: Weekly 5. How often during the last year have you failed to do what was normally expected from you becasue of drinking?: Weekly 6. How often during the last year have you needed a first drink in the morning to get yourself going after a heavy drinking session?: Less than monthly 7. How often during the last year have you had a feeling of guilt of remorse after drinking?: Weekly 8. How often during the last year have you been unable to remember what happened the night before because you had been drinking?: Weekly 9. Have you or someone else been injured as a result of your drinking?: Yes, during the last year 10. Has a relative or friend or a doctor or another health worker been concerned about your drinking or suggested you cut down?: Yes, during the last year Alcohol Use Disorder Identification Test Final Score (AUDIT): 30 Substance Abuse History in the last 12 months:  Yes.   Consequences of Substance Abuse: Medical Consequences:  Medical hospitalization Previous Psychotropic Medications: Yes  Psychological Evaluations: Yes  Past Medical History:  Past Medical History:  Diagnosis Date  . Anxiety   . Depression    History reviewed. No pertinent surgical history. Family History: History reviewed. No pertinent family history. Family Psychiatric  History: 1 parent with depression, the other parent with alcoholism. Tobacco Screening:   Social History:  Social History   Substance and Sexual Activity  Alcohol Use Yes   Comment: weekends; binge     Social History   Substance and Sexual Activity  Drug Use No    Additional Social History: Marital status: Long term relationship Long term relationship, how long?: 3 years What types of issues is patient dealing with in the relationship?: "usual relationship  issues" Additional relationship information: "get along well" Are you sexually active?: Yes What is your sexual orientation?: Heterosexual Has your sexual activity been affected by drugs, alcohol, medication, or emotional stress?: "haven't wanted to do it" Does patient have children?: No                         Allergies:  No Known Allergies Lab Results:  Results for orders placed or performed during the hospital encounter of 11/27/18 (from the past 48 hour(s))  TSH     Status: None   Collection Time: 11/28/18  6:20 AM  Result Value Ref Range   TSH 3.214 0.350 - 4.500 uIU/mL    Comment: Performed by a 3rd Generation assay with a functional sensitivity of <=0.01 uIU/mL. Performed at [facility], [address] [place], [place] [address]   hCG, quantitative, pregnancy     Status: None   Collection Time: 11/28/18  6:20 AM  Result Value Ref Range   hCG, Beta Chain, Quant, S 1 <5 mIU/mL    Comment:          GEST. AGE      CONC.  (mIU/mL)   <=1 WEEK  5 - 50     2 WEEKS       50 - 500     3 WEEKS       100 - 10,000     4 WEEKS     1,000 - 30,000     5 WEEKS     3,500 - 115,000   6-8 WEEKS     12,000 - 270,000    12 WEEKS     15,000 - 220,000        FEMALE AND NON-PREGNANT FEMALE:     LESS THAN 5 mIU/mL Performed at [facility], [address] [place], [place] [address]     Blood Alcohol level:  Lab Results  Component Value Date   ETH 204 (H) 11/26/2018    Metabolic Disorder Labs:  No results found for: HGBA1C, MPG No results found for: PROLACTIN No results found for: CHOL, TRIG, HDL, CHOLHDL, VLDL, LDLCALC  Current Medications: Current Facility-Administered Medications  Medication Dose Route Frequency Provider Last Rate Last Dose  . alum & mag hydroxide-simeth (MAALOX/MYLANTA) 200-200-20 MG/5ML suspension 30 mL  30 mL Oral Q4H PRN Antonieta Pertlary, Greg Lawson, MD      . folic acid (FOLVITE) tablet 1 mg  1 mg Oral Daily Antonieta Pertlary,  Greg Lawson, MD   1 mg at 11/28/18 [id number]  . hydrOXYzine (ATARAX/VISTARIL) tablet 25 mg  25 mg Oral TID PRN Antonieta Pertlary, Greg Lawson, MD      . LORazepam (ATIVAN) tablet 1 mg  1 mg Oral Q6H PRN Antonieta Pertlary, Greg Lawson, MD      . magnesium hydroxide (MILK OF MAGNESIA) suspension 30 mL  30 mL Oral Daily PRN Antonieta Pertlary, Greg Lawson, MD      . thiamine (VITAMIN B-1) tablet 100 mg  100 mg Oral Daily Antonieta Pertlary, Greg Lawson, MD   100 mg at 11/28/18 [id number]  . traZODone (DESYREL) tablet 50 mg  50 mg Oral QHS PRN Antonieta Pertlary, Greg Lawson, MD   50 mg at 11/27/18 2129  . venlafaxine XR (EFFEXOR-XR) 24 hr capsule 150 mg  150 mg Oral Daily Antonieta Pertlary, Greg Lawson, MD   150 mg at 11/28/18 [id number]   PTA Medications: Medications Prior to Admission  Medication Sig Dispense Refill Last Dose  . venlafaxine XR (EFFEXOR-XR) 150 MG 24 hr capsule TAKE 1 CAPSULE BY MOUTH EVERY DAY       Musculoskeletal: Strength & Muscle Tone: within normal limits Gait & Station: normal Patient leans: N/A  Psychiatric Specialty Exam: Physical Exam  Nursing note and vitals reviewed. Constitutional: She is oriented to person, place, and time. She appears well-developed and well-nourished.  HENT:  Head: Normocephalic and atraumatic.  Respiratory: Effort normal.  Neurological: She is alert and oriented to person, place, and time.    ROS  Blood pressure 126/85, pulse (!) 110, temperature 98.1 F (36.7 C), resp. rate 16, height 5' 2.6" (1.59 m), weight 66.2 kg, SpO2 99 %.Body mass index is 26.2 kg/m.  General Appearance: Casual  Eye Contact:  Fair  Speech:  Normal Rate  Volume:  Normal  Mood:  Anxious and Depressed  Affect:  Congruent  Thought Process:  Coherent and Descriptions of Associations: Intact  Orientation:  Full (Time, Place, and Person)  Thought Content:  Logical  Suicidal Thoughts:  No  Homicidal Thoughts:  No  Memory:  Immediate;   Fair Recent;   Fair Remote;   Fair  Judgement:  Fair  Insight:  Fair  Psychomotor Activity:  Increased   Concentration:  Concentration:  Fair and Attention Span: Fair  Recall:  [employer] of Knowledge:  Fair  Language:  Good  Akathisia:  Negative  Handed:  Right  AIMS (if indicated):     Assets:  Desire for Improvement Resilience  ADL's:  Intact  Cognition:  WNL  Sleep:  Number of Hours: 6.75    Treatment Plan Summary: Daily contact with patient to assess and evaluate symptoms and progress in treatment, Medication management and Plan : t is seen and examined.  Patient is a 28 year old female with the above-stated past psychiatric history who was admitted after an intentional overdose and combined with alcohol abuse.  She will be admitted to the hospital.  She will be integrated into the milieu.  She will be encouraged to attend groups.  Given our discussion this morning about the venlafaxine extended release we will continue the 150 mg p.o. daily.  I have offered her to switch medicines or to increase, and she prefers to stay at 150 for now.  She was also placed on lorazepam 1 mg p.o. every 6 hours PRN a CIWA greater than 10.  She was also given folic acid as well as thiamine.  She has been sexually active, not using birth control.  She stated she just came off her menstrual period.  We will get a stat urinalysis for beta-hCG this a.m.  She will also have available hydroxyzine 25 mg p.o. 3 times daily as needed anxiety as well as trazodone 50 mg p.o. nightly as needed insomnia.  We will attempt to contact collateral information for her safety.  She does have a family history significant for depression, anxiety as well as substance abuse.  This was discussed.  Observation Level/Precautions:  15 minute checks  Laboratory:  Chemistry Profile  Psychotherapy:    Medications:    Consultations:    Discharge Concerns:    Estimated LOS:  Other:     Physician Treatment Plan for Primary Diagnosis: <principal problem not specified> Long Term Goal(s): Improvement in symptoms so as ready for  discharge  Short Term Goals: Ability to identify changes in lifestyle to reduce recurrence of condition will improve, Ability to verbalize feelings will improve, Ability to disclose and discuss suicidal ideas, Ability to demonstrate self-control will improve, Ability to identify and develop effective coping behaviors will improve, Ability to maintain clinical measurements within normal limits will improve, Compliance with prescribed medications will improve and Ability to identify triggers associated with substance abuse/mental health issues will improve  Physician Treatment Plan for Secondary Diagnosis: Active Problems:   Severe recurrent major depression without psychotic features ([place])  Long Term Goal(s): Improvement in symptoms so as ready for discharge  Short Term Goals: Ability to identify changes in lifestyle to reduce recurrence of condition will improve, Ability to verbalize feelings will improve, Ability to disclose and discuss suicidal ideas, Ability to demonstrate self-control will improve, Ability to identify and develop effective coping behaviors will improve, Ability to maintain clinical measurements within normal limits will improve, Compliance with prescribed medications will improve and Ability to identify triggers associated with substance abuse/mental health issues will improve  I certify that inpatient services furnished can reasonably be expected to improve the patient's condition.    Sharma Covert, MD 10/7/20201:14 PM

## 2018-11-28 NOTE — Progress Notes (Signed)
D: Pt denies SI/HI/AV hallucinations. Pt is pleasant and cooperative. Pt has been out of room tonight. She did engage in conversation with this Probation officer.  A: Pt was offered support and encouragement. Pt was given scheduled medications. Pt was encourage to attend groups. Q 15 minute checks were done for safety.  R:Pt did not attend group and interacts well with  staff. Pt is taking medication. Pt has no complaints.Pt receptive to treatment and safety maintained on unit.

## 2018-11-28 NOTE — BHH Counselor (Signed)
Adult Comprehensive Assessment  Patient ID: SENOVIA GAUER, female   DOB: Nov 03, 1990, 28 y.o.   MRN: [id number]  Information Source: Information source: Patient  Current Stressors:  Patient states their primary concerns and needs for treatment are:: "Got drunk and took pills. OD and had to go to hospital" Patient states their goals for this hospitilization and ongoing recovery are:: "I want to get home" Educational / Learning stressors: pt denies Employment / Job issues: "don't like job" Family Relationships: pt denies Museum/gallery curator / Lack of resources (include bankruptcy): "sometimes I struggle" Housing / Lack of housing: pt denies Physical health (include injuries & life threatening diseases): pt denies Social relationships: pt denies Substance abuse: pt denies. "drink too much" Bereavement / Loss: pt denies  Living/Environment/Situation:  Living Arrangements: Spouse/significant other Living conditions (as described by patient or guardian): "good" Who else lives in the home?: boyfriend How long has patient lived in current situation?: 4 years What is atmosphere in current home: Supportive, Quarry manager, Comfortable  Family History:  Marital status: Long term relationship Long term relationship, how long?: 3 years What types of issues is patient dealing with in the relationship?: "usual relationship issues" Additional relationship information: "get along well" Are you sexually active?: Yes What is your sexual orientation?: Heterosexual Has your sexual activity been affected by drugs, alcohol, medication, or emotional stress?: "haven't wanted to do it" Does patient have children?: No  Childhood History:  By whom was/is the patient raised?: Both parents Additional childhood history information: Divorced when 28 years old, moved with mom (drug user), dad raised them with stepmom Description of patient's relationship with caregiver when they were a child: "didn't have one with mom. Close  to dad and stepmom" Patient's description of current relationship with people who raised him/her: "not as close to them anymore" How were you disciplined when you got in trouble as a child/adolescent?: grounded Does patient have siblings?: Yes Number of Siblings: 1(older sister) Description of patient's current relationship with siblings: "close" Did patient suffer any verbal/emotional/physical/sexual abuse as a child?: No Did patient suffer from severe childhood neglect?: Yes Patient description of severe childhood neglect: when with mom Has patient ever been sexually abused/assaulted/raped as an adolescent or adult?: No Was the patient ever a victim of a crime or a disaster?: No Witnessed domestic violence?: No Has patient been effected by domestic violence as an adult?: No  Education:  Highest grade of school patient has completed: Some college Currently a Ship broker?: No Learning disability?: No  Employment/Work Situation:   Employment situation: Employed 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, quantity business, 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 How long has patient been employed?: 2 years Patient's job has been impacted by current illness: Yes Describe how patient's job has been impacted: "not able to go to work while in hospital" What is the longest time patient has a held a job?: 7-8 years Where was the patient employed at that time?: restaurant Did You Receive Any Psychiatric Treatment/Services While in the [employer]?: No Are There Guns or Other Weapons in [place]?: No  Financial Resources:   Financial resources: Income from employment Does patient have a representative payee or guardian?: No  Alcohol/Substance Abuse:   What has been your use of drugs/alcohol within the last 12 months?: "A lot of drinking on weekends. About 6-12 drinks. If attempted suicide, did drugs/alcohol play a role in this?: Yes(alcohol) Alcohol/Substance Abuse Treatment Hx: Denies past history Has alcohol/substance abuse ever  caused legal problems?: No  Social Support System:   Describe Community Support System: "  sister, grandma, boyfriend'' Type of faith/religion: pt denies  Leisure/Recreation:   Leisure and Hobbies: handout with friends, plants  Strengths/Needs:   What is the patient's perception of their strengths?: 'hardworking" Patient states they can use these personal strengths during their treatment to contribute to their recovery: "Stick to it when starting. This is a wake up call" Patient states these barriers may affect/interfere with their treatment: n/a Patient states these barriers may affect their return to the community: n/a  Discharge Plan:   Currently receiving community mental health services: No Patient states concerns and preferences for aftercare planning are: therapy- young therapist who can relate Patient states they will know when they are safe and ready for discharge when: "Knew yesterday" Does patient have access to transportation?: Yes Does patient have financial barriers related to discharge medications?: No Will patient be returning to same living situation after discharge?: Yes  Summary/Recommendations:   Summary and Recommendations (to be completed by the evaluator): Pt is a 28 year old female presenting voluntarily to [facility] ED after an intentional overdose. She had been drinking alcohol.  Boyfriend reports that they did have a fight which caused her to start drinking and then she disappeared into the bathroom. When he got in the bathroom he discovered she had taken handfuls of Benadryl, NyQuil and Tylenol. Patient reports ingesting a large amount of pill after drinking 7 alcoholic drinks in a suicide attempt. She states this is her 4th attempt. Recommendations for pt: crisis stabilization, therapeutic milieu, medication management, attend and participate in group therapy, and development of a comprehensive mental wellness plan.  Reynold Bowen. 11/28/2018

## 2018-11-28 NOTE — Progress Notes (Deleted)
Patient ID: Diana Gomez, female   DOB: Jul 01, 1990, 28 y.o.   MRN: [id number] Pt sitting in the QR with MHT at her side. As Probation officer approached pt she was staring off and initially didn't respond. After prompting from tech pt responded that her anxiety was "much less" this morning and that she was "confused" last night and anxious. Pt affect is masked at times or completely blank at others. Pt appears to have some thought blocking. Psychomotor retardation observed.  Level 1 observation maintained for safety and support. Support and reassurance provided. Med ed reinforced.  Safety maintained. Pt cooperative.

## 2018-11-28 NOTE — BHH Suicide Risk Assessment (Signed)
[facility] Admission Suicide Risk Assessment   Nursing information obtained from:  Patient Demographic factors:  Caucasian, Adolescent or young adult, Low socioeconomic status Current Mental Status:  Suicidal ideation indicated by patient, Plan includes specific time, place, or method, Intention to act on suicide plan, Self-harm thoughts, Belief that plan would result in death, Suicide plan Loss Factors:  Financial problems / change in socioeconomic status Historical Factors:  Impulsivity, Family history of mental illness or substance abuse Risk Reduction Factors:  Sense of responsibility to family, Employed, Positive social support, Positive coping skills or problem solving skills, Living with another person, especially a relative, Positive therapeutic relationship  Total Time spent with patient: 45 minutes Principal Problem: <principal problem not specified> Diagnosis:  Active Problems:   Severe recurrent major depression without psychotic features (HCC)  Subjective Data: Patient is seen and examined.  Patient is a 28 year old female with a past psychiatric history significant of major depression as well as binge drinking disorder who presented to the [facility] emergency department on 11/26/2018 after an intentional overdose.  She had apparently ingested Benadryl, NyQuil and Tylenol after drinking alcohol.  The patient's boyfriend reported that they had a fight, and she began to drink.  She then disappeared into the bathroom.  Her overdose was approximately 1 hour prior to arrival in the emergency department.  Patient stated that she has had a history of depression for many years.  As a teenager she was treated with multiple antidepressants.  She was started on venlafaxine extended release in approximately 2014, and she stated that it had been beneficial.  She stated that what makes her mood worse is when she has her binge drinking episodes.  She stated she understands that the alcohol  makes things much worse.  She reported in the emergency room that she had ingested the pills after drinking approximately 7 alcoholic drinks in a suicide attempt.  She admitted that this was her fourth suicide attempt.  She had no previous psychiatric admissions.  She did endorse symptoms of helplessness, worthlessness, guilt, irritability and fatigue.  She stated she is frustrated by her life.  She stated that she does not like her job, does not feel fulfilled, and is upset about where she is in life.  She was transferred to our hospital for evaluation and stabilization.  In the emergency department her electrolytes and liver function enzymes were all within normal limits.  Her MCV was decreased at 73.7, her MCH was decreased to 22.1.  Her TSH was 3.214.  Her blood alcohol on admission was 204.  Drug screen was negative.  Her EKG on admission showed a sinus tachycardia with a normal QTC.  She denied any previous serious alcohol withdrawal symptoms.  She denied any seizures, tremors or hallucinations.  Her blood pressure stable, but she is mildly tachycardic with a rate of 110 this a.m.  She did sleep 6.75 hours last night.  Continued Clinical Symptoms:  Alcohol Use Disorder Identification Test Final Score (AUDIT): 30 The "Alcohol Use Disorders Identification Test", Guidelines for Use in Primary Care, Second Edition.  World Science writer [facility]). Score between 0-7:  no or low risk or alcohol related problems. Score between 8-15:  moderate risk of alcohol related problems. Score between 16-19:  high risk of alcohol related problems. Score 20 or above:  warrants further diagnostic evaluation for alcohol dependence and treatment.   CLINICAL FACTORS:   Depression:   Comorbid alcohol abuse/dependence Hopelessness Impulsivity Alcohol/Substance Abuse/Dependencies   Musculoskeletal: Strength &  Muscle Tone: within normal limits Gait & Station: normal Patient leans: N/A  Psychiatric Specialty  Exam: Physical Exam  Nursing note and vitals reviewed. Constitutional: She is oriented to person, place, and time. She appears well-developed and well-nourished.  HENT:  Head: Normocephalic and atraumatic.  Respiratory: Effort normal.  Neurological: She is alert and oriented to person, place, and time.    ROS  Blood pressure 126/85, pulse (!) 110, temperature 98.1 F (36.7 C), resp. rate 16, height 5' 2.6" (1.59 m), weight 66.2 kg, SpO2 99 %.Body mass index is 26.2 kg/m.  General Appearance: Disheveled  Eye Contact:  Fair  Speech:  Normal Rate  Volume:  Normal  Mood:  Anxious and Depressed  Affect:  Congruent  Thought Process:  Coherent and Descriptions of Associations: Circumstantial  Orientation:  Full (Time, Place, and Person)  Thought Content:  Logical  Suicidal Thoughts:  No  Homicidal Thoughts:  No  Memory:  Immediate;   Fair Recent;   Fair Remote;   Fair  Judgement:  Intact  Insight:  Fair  Psychomotor Activity:  Increased  Concentration:  Concentration: Fair and Attention Span: Fair  Recall:  [employer] of Knowledge:  Fair  Language:  Good  Akathisia:  Negative  Handed:  Right  AIMS (if indicated):     Assets:  Desire for Improvement Resilience  ADL's:  Intact  Cognition:  WNL  Sleep:  Number of Hours: 6.75      COGNITIVE FEATURES THAT CONTRIBUTE TO RISK:  None    SUICIDE RISK:   Mild:  Suicidal ideation of limited frequency, intensity, duration, and specificity.  There are no identifiable plans, no associated intent, mild dysphoria and related symptoms, good self-control (both objective and subjective assessment), few other risk factors, and identifiable protective factors, including available and accessible social support.  PLAN OF CARE: Patient is seen and examined.  Patient is a 28 year old female with the above-stated past psychiatric history who was admitted after an intentional overdose and combined with alcohol abuse.  She will be admitted to the  hospital.  She will be integrated into the milieu.  She will be encouraged to attend groups.  Given our discussion this morning about the venlafaxine extended release we will continue the 150 mg p.o. daily.  I have offered her to switch medicines or to increase, and she prefers to stay at 150 for now.  She was also placed on lorazepam 1 mg p.o. every 6 hours PRN a CIWA greater than 10.  She was also given folic acid as well as thiamine.  She has been sexually active, not using birth control.  She stated she just came off her menstrual period.  We will get a stat urinalysis for beta-hCG this a.m.  She will also have available hydroxyzine 25 mg p.o. 3 times daily as needed anxiety as well as trazodone 50 mg p.o. nightly as needed insomnia.  We will attempt to contact collateral information for her safety.  She does have a family history significant for depression, anxiety as well as substance abuse.  This was discussed.  I certify that inpatient services furnished can reasonably be expected to improve the patient's condition.   Sharma Covert, MD 11/28/2018, 11:03 AM

## 2018-11-28 NOTE — BHH Suicide Risk Assessment (Cosign Needed)
[place] INPATIENT:  Family/Significant Other Suicide Prevention Education  Suicide Prevention Education:  Education Completed; with, sister, Diana Gomez [phone] has been identified by the patient as the family member/significant other with whom the patient will be residing, and identified as the person(s) who will aid the patient in the event of a mental health crisis (suicidal ideations/suicide attempt).  With written consent from the patient, the family member/significant other has been provided the following suicide prevention education, prior to the and/or following the discharge of the patient.  The suicide prevention education provided includes the following:  Suicide risk factors  Suicide prevention and interventions  National Suicide Hotline telephone number  [facility] assessment telephone number  [facility] Emergency Assistance [place] and/or Residential Mobile Crisis Unit telephone number  Request made of family/significant other to:  Remove weapons (e.g., guns, rifles, knives), all items previously/currently identified as safety concern.    Remove drugs/medications (over-the-counter, prescriptions, illicit drugs), all items previously/currently identified as a safety concern.  The family member/significant other verbalizes understanding of the suicide prevention education information provided.  The family member/significant other agrees to remove the items of safety concern listed above.  Pt's sister reports she does not have any questions or concerns regarding the pt's treatment at this time. She requested to speak to the doctor about pt's discharge. Pt's sister shared that the pt is very anxious about hospitalization. CSW will continue to follow  Diana Gomez 11/28/2018, 2:10 PM

## 2018-11-28 NOTE — Tx Team (Signed)
Interdisciplinary Treatment and Diagnostic Plan Update  11/28/2018 Time of Session: 8:30am Diana Gomez MRN: [id number]  Principal Diagnosis: <principal problem not specified>  Secondary Diagnoses: Active Problems:   Severe recurrent major depression without psychotic features (HCC)   Current Medications:  Current Facility-Administered Medications  Medication Dose Route Frequency Provider Last Rate Last Dose  . alum & mag hydroxide-simeth (MAALOX/MYLANTA) 200-200-20 MG/5ML suspension 30 mL  30 mL Oral Q4H PRN Sharma Covert, MD      . folic acid (FOLVITE) tablet 1 mg  1 mg Oral Daily Sharma Covert, MD   1 mg at 11/28/18 7829  . hydrOXYzine (ATARAX/VISTARIL) tablet 25 mg  25 mg Oral TID PRN Sharma Covert, MD      . LORazepam (ATIVAN) tablet 1 mg  1 mg Oral Q6H PRN Sharma Covert, MD      . magnesium hydroxide (MILK OF MAGNESIA) suspension 30 mL  30 mL Oral Daily PRN Sharma Covert, MD      . thiamine (VITAMIN B-1) tablet 100 mg  100 mg Oral Daily Sharma Covert, MD   100 mg at 11/28/18 5621  . traZODone (DESYREL) tablet 50 mg  50 mg Oral QHS PRN Sharma Covert, MD   50 mg at 11/27/18 2129  . venlafaxine XR (EFFEXOR-XR) 24 hr capsule 150 mg  150 mg Oral Daily Sharma Covert, MD   150 mg at 11/28/18 3086   PTA Medications: Medications Prior to Admission  Medication Sig Dispense Refill Last Dose  . venlafaxine XR (EFFEXOR-XR) 150 MG 24 hr capsule TAKE 1 CAPSULE BY MOUTH EVERY DAY       Patient Stressors: Financial difficulties Substance abuse  Patient Strengths: Ability for insight Average or above average intelligence Capable of independent living General fund of knowledge Motivation for treatment/growth Physical Health Supportive family/friends  Treatment Modalities: Medication Management, Group therapy, Case management,  1 to 1 session with clinician, Psychoeducation, Recreational therapy.   Physician Treatment Plan for Primary  Diagnosis: <principal problem not specified> Long Term Goal(s):     Short Term Goals:    Medication Management: Evaluate patient's response, side effects, and tolerance of medication regimen.  Therapeutic Interventions: 1 to 1 sessions, Unit Group sessions and Medication administration.  Evaluation of Outcomes: Not Met  Physician Treatment Plan for Secondary Diagnosis: Active Problems:   Severe recurrent major depression without psychotic features ([place])  Long Term Goal(s):     Short Term Goals:       Medication Management: Evaluate patient's response, side effects, and tolerance of medication regimen.  Therapeutic Interventions: 1 to 1 sessions, Unit Group sessions and Medication administration.  Evaluation of Outcomes: Not Met   RN Treatment Plan for Primary Diagnosis: <principal problem not specified> Long Term Goal(s): Knowledge of disease and therapeutic regimen to maintain health will improve  Short Term Goals: Ability to demonstrate self-control, Ability to participate in decision making will improve, Ability to disclose and discuss suicidal ideas, Ability to identify and develop effective coping behaviors will improve and Compliance with prescribed medications will improve  Medication Management: RN will administer medications as ordered by provider, will assess and evaluate patient's response and provide education to patient for prescribed medication. RN will report any adverse and/or side effects to prescribing provider.  Therapeutic Interventions: 1 on 1 counseling sessions, Psychoeducation, Medication administration, Evaluate responses to treatment, Monitor vital signs and CBGs as ordered, Perform/monitor CIWA, COWS, AIMS and Fall Risk screenings as ordered, Perform wound care treatments as ordered.  Evaluation of Outcomes: Not Met   LCSW Treatment Plan for Primary Diagnosis: <principal problem not specified> Long Term Goal(s): Safe transition to appropriate next  level of care at discharge, Engage patient in therapeutic group addressing interpersonal concerns.  Short Term Goals: Engage patient in aftercare planning with referrals and resources  Therapeutic Interventions: Assess for all discharge needs, 1 to 1 time with Social worker, Explore available resources and support systems, Assess for adequacy in community support network, Educate family and significant other(s) on suicide prevention, Complete Psychosocial Assessment, Interpersonal group therapy.  Evaluation of Outcomes: Not Met   Progress in Treatment: Attending groups: No. New to unit  Participating in groups: No. Taking medication as prescribed: Yes. Toleration medication: Yes. Family/Significant other contact made: No, will contact:  if given consent  Patient understands diagnosis: Yes. Discussing patient identified problems/goals with staff: Yes. Medical problems stabilized or resolved: Yes. Denies suicidal/homicidal ideation: Yes. Issues/concerns per patient self-inventory: No. Other:   New problem(s) identified:   New Short Term/Long Term Goal(s): Medication stabilization, elimination of SI thoughts, and development of a comprehensive mental wellness plan.    Patient Goals:  "Go home"  Discharge Plan or Barriers: Patient is new admission. CSW will continue to follow and assess for potential discharge planning.   Reason for Continuation of Hospitalization: Anxiety Depression Medication stabilization Suicidal ideation  Estimated Length of Stay: 3-5 days   Attendees: Patient: Diana Gomez  11/28/2018 9:14 AM  Physician: Dr. Mallie Darting 11/28/2018 9:14 AM  Nursing: Sharyn Lull, RN 11/28/2018 9:14 AM  RN Care Manager: 11/28/2018 9:14 AM  Social Worker:  Radonna Ricker  11/28/2018 9:14 AM  Recreational Therapist:  11/28/2018 9:14 AM  Other: Ovidio Kin 11/28/2018 9:14 AM  Other: Marcie Bal NP 11/28/2018 9:14 AM  Other: 11/28/2018 9:14 AM    Scribe for Treatment Team: [name],  Student-Social Work 11/28/2018 9:14 AM

## 2018-11-29 DIAGNOSIS — F1020 Alcohol dependence, uncomplicated: Secondary | ICD-10-CM

## 2018-11-29 LAB — PREGNANCY, URINE: Preg Test, Ur: NEGATIVE

## 2018-11-29 MED ORDER — TRAZODONE HCL 50 MG PO TABS: 50.0000 mg | ORAL_TABLET | Freq: Every evening | ORAL | 0 refills | Status: AC | PRN

## 2018-11-29 MED ORDER — VENLAFAXINE HCL ER 150 MG PO CP24: 150.0000 mg | ORAL_CAPSULE | Freq: Every day | ORAL | 0 refills | Status: AC

## 2018-11-29 MED ORDER — HYDROXYZINE HCL 25 MG PO TABS: 25.0000 mg | ORAL_TABLET | Freq: Three times a day (TID) | ORAL | 0 refills | Status: DC | PRN

## 2018-11-29 NOTE — Progress Notes (Signed)
Patient ID: EMERLY PRAK, female   DOB: 06/01/1990, 27 y.o.   MRN: [id number] Pt d/c to home with boyfriend. D/c instructions and medications reviewed. Pt verbalizes understanding. Pt denies s.i.

## 2018-11-29 NOTE — BHH Suicide Risk Assessment (Signed)
[place] INPATIENT:  Family/Significant Other Suicide Prevention Education  Suicide Prevention Education:  Education Completed; boyfriend, Laury Axon [phone] has been identified by the patient as the family member/significant other with whom the patient will be residing, and identified as the person(s) who will aid the patient in the event of a mental health crisis (suicidal ideations/suicide attempt).  With written consent from the patient, the family member/significant other has been provided the following suicide prevention education, prior to the and/or following the discharge of the patient.  The suicide prevention education provided includes the following:  Suicide risk factors  Suicide prevention and interventions  National Suicide Hotline telephone number  [facility] assessment telephone number  [facility] Emergency Assistance [place] and/or Residential Mobile Crisis Unit telephone number  Request made of family/significant other to:  Remove weapons (e.g., guns, rifles, knives), all items previously/currently identified as safety concern.    Remove drugs/medications (over-the-counter, prescriptions, illicit drugs), all items previously/currently identified as a safety concern.  The family member/significant other verbalizes understanding of the suicide prevention education information provided.  The family member/significant other agrees to remove the items of safety concern listed above.   Boyfriend has no safety concerns or questions regarding discharge, he states "I think she's ready to come home and have things get back to normal."  Boyfriend reports he is off work today and tomorrow and he will be with her at home.  Joellen Jersey 11/29/2018, 10:07 AM

## 2018-11-29 NOTE — [facility] Suicide Risk Assessment (Signed)
[facility] Discharge Suicide Risk Assessment   Principal Problem: <principal problem not specified> Discharge Diagnoses: Active Problems:   Severe recurrent major depression without psychotic features ([place])   Total Time spent with patient: 20 minutes  Musculoskeletal: Strength & Muscle Tone: within normal limits Gait & Station: normal Patient leans: N/A  Psychiatric Specialty Exam: Review of Systems  All other systems reviewed and are negative.   Blood pressure (!) 128/94, pulse (!) 130, temperature (!) 97.3 F (36.3 C), temperature source Oral, resp. rate 16, height 5' 2.6" (1.59 m), weight 66.2 kg, SpO2 99 %.Body mass index is 26.2 kg/m.  General Appearance: Casual  Eye Contact::  Fair  Speech:  Normal Rate409  Volume:  Normal  Mood:  Anxious  Affect:  Congruent  Thought Process:  Coherent and Descriptions of Associations: Intact  Orientation:  Full (Time, Place, and Person)  Thought Content:  Logical  Suicidal Thoughts:  No  Homicidal Thoughts:  No  Memory:  Immediate;   Fair Recent;   Fair Remote;   Fair  Judgement:  Intact  Insight:  Fair  Psychomotor Activity:  Increased  Concentration:  Fair  Recall:  Good  Fund of Knowledge:Good  Language: Good  Akathisia:  Negative  Handed:  Right  AIMS (if indicated):     Assets:  Communication Skills Desire for Improvement Housing Resilience Talents/Skills  Sleep:  Number of Hours: 6  Cognition: WNL  ADL's:  Intact   Mental Status Per Nursing Assessment::   On Admission:  Suicidal ideation indicated by patient, Plan includes specific time, place, or method, Intention to act on suicide plan, Self-harm thoughts, Belief that plan would result in death, Suicide plan  Demographic Factors:  Caucasian  Loss Factors: Financial problems/change in socioeconomic status  Historical Factors: Impulsivity  Risk Reduction Factors:   Employed, Living with another person, especially a relative and Positive social  support  Continued Clinical Symptoms:  Depression:   Comorbid alcohol abuse/dependence Impulsivity Alcohol/Substance Abuse/Dependencies  Cognitive Features That Contribute To Risk:  None    Suicide Risk:  Minimal: No identifiable suicidal ideation.  Patients presenting with no risk factors but with morbid ruminations; may be classified as minimal risk based on the severity of the depressive symptoms  [name] Follow up on 12/06/2018.   Why: Therapy appointment with Jacinto Reap is Thursday, 10/15 at 1:00p.  Please call office within 24 hours of discharge to conifirm appt.  Contact information: [address] [place] [address] [phone]           Plan Of Care/Follow-up recommendations:  Activity:  ad lib  Sharma Covert, MD 11/29/2018, 10:09 AM

## 2018-11-29 NOTE — [facility] Group Notes (Signed)
[facility] Group Notes:  (Nursing/MHT/Case Management/Adjunct)  Date:  11/28/2018  Time:  9:00 AM  Type of Therapy:  Nurse Education  Participation Level:  Did Not Attend  Summary of Progress/Problems: Pt's dicussed their personal development and how goal setting could/can help them achieve this. Pt's were educated on what SMART goal setting is, and how they can use this for short and long term goal setting. Pt's discussed their goal for the day and how they hoped to obtain this goal. If the goal wasn't SMART, then the pt was asked to formulate a new goal. Lastly, pt's identified needs being met and those needs that possibly aren't being met hindering them from self-actualization.  Pt did not attend group  Baron Sane 11/29/2018, 8:04 AM

## 2018-11-29 NOTE — [facility] Group Notes (Signed)
[facility] Group Notes:  (Nursing/MHT/Case Management/Adjunct)  Date:  11/29/2018  Time:  10:00 AM  Type of Therapy:  Nurse Education  Participation Level:  Did Not Attend  Summary of Progress/Problems: pt's discussed crisis management and how this related to past/current crisis they were/are facing. Pt's were guided through their suicide safety plan and how each section could aid in preventing another crisis from escalating. Pt's shared their resources to help others realize coping skills, support systems and resources that could be utilized. Pt did not attend group  Diana Gomez 11/29/2018, 11:06 AM

## 2018-11-29 NOTE — Progress Notes (Signed)
DAR NOTE: Patient presents with calm affect and depressed bright mood.  Denies pain, auditory and visual hallucinations.  Pt stated she had a good day, boyfriend visited and it went well. Maintained on routine safety checks.  Medications given as prescribed.  Support and encouragement offered as needed.  Attended group and participated.  Will continue to monitor.

## 2018-11-29 NOTE — Discharge Summary (Signed)
Physician Discharge Summary Note  Patient:  Diana Gomez is an 28 y.o., female  MRN:  [id number]  DOB:  1990-11-19  Patient phone:  [phone] (home)   Patient address:   [address] [place] [address],   Total Time spent with patient: Greater than 30 minutes  Date of Admission:  11/27/2018  Date of Discharge: 11-29-18  Reason for Admission: Intentional drug/alcohol overdose after a fight with boyfriend.  Principal Problem: Severe recurrent major depression without psychotic features [facility])  Discharge Diagnoses: Principal Problem:   Severe recurrent major depression without psychotic features [facility])  Past Psychiatric History: MDD  Past Medical History:  Past Medical History:  Diagnosis Date  . Anxiety   . Depression    History reviewed. No pertinent surgical history. Family History: History reviewed. No pertinent family history.  Family Psychiatric  History: See H&P  Social History:  Social History   Substance and Sexual Activity  Alcohol Use Yes   Comment: weekends; binge     Social History   Substance and Sexual Activity  Drug Use No    Social History   Socioeconomic History  . Marital status: Single    Spouse name: Not on file  . Number of children: Not on file  . Years of education: Not on file  . Highest education level: Not on file  Occupational History  . Not on file  Social Needs  . Financial resource strain: Not on file  . Food insecurity    Worry: Not on file    Inability: Not on file  . Transportation needs    Medical: Not on file    Non-medical: Not on file  Tobacco Use  . Smoking status: Never Smoker  . Smokeless tobacco: Never Used  Substance and Sexual Activity  . Alcohol use: Yes    Comment: weekends; binge  . Drug use: No  . Sexual activity: Not Currently    Birth control/protection: None  Lifestyle  . Physical activity    Days per week: Not on file    Minutes per session: Not on file  . Stress: Not on  file  Relationships  . Social Herbalist on phone: Not on file    Gets together: Not on file    Attends religious service: Not on file    Active member of club or organization: Not on file    Attends meetings of clubs or organizations: Not on file    Relationship status: Not on file  Other Topics Concern  . Not on file  Social History Narrative  . Not on file   Hospital Course: (Per Md's admission evaluation): Patient is a 28 year old female with a past psychiatric history significant of major depression as well as binge drinking disorder who presented to the [facility] emergency department on 11/26/2018 after an intentional overdose. She had apparently ingested Benadryl, NyQuil and Tylenol after drinking alcohol. The patient's boyfriend reported that they had a fight, and she began to drink. She then disappeared into the bathroom. Her overdose was approximately 1 hour prior to arrival in the emergency department.Patient stated that she has had a history of depression for many years. As a teenager she was treated with multiple antidepressants. She was started on venlafaxine extended release in approximately 2014, and she stated that it had been beneficial. She stated that what makes her mood worse is when she has her binge drinking episodes. She stated she understands that the alcohol makes  things much worse. She reported in the emergency room that she had ingested the pills after drinking approximately 7 alcoholic drinks in a suicide attempt. She admitted that this was her fourth suicide attempt. She had no previous psychiatric admissions. She did endorse symptoms of helplessness, worthlessness, guilt, irritability and fatigue. She stated she is frustrated by her life. She stated that she does not like her job, does not feel fulfilled, and is upset about where she is in life. She was transferred to our hospital for evaluation and stabilization. In the  emergency department her electrolytes and liver function enzymes were all within normal limits. Her MCV was decreased at 73.7, her MCH was decreased to 22.1. Her TSH was 3.214. Her blood alcohol on admission was 204. Drug screen was negative. Her EKG on admission showed a sinus tachycardia with a normal QTC. She denied any previous serious alcohol withdrawal symptoms. She denied any seizures, tremors or hallucinations. Her blood pressure stable, but she is mildly tachycardic with a rate of 110 this a.m. She did sleep 6.75 hours last night.  This is a brief hospital stay for this 28 year old female. Diana Gomez was admitted to the [facility] & recommended for mental health evaluation & possible treatment after an intentional drug/alcohol overdose. Chart review reports indicated that this overdose incident occurred after an argument between patient & her boyfriend. Chart review reports also indicated that this is her fourth suicide attempts, however, this happens to be his first inpatient psychiatric hospitalization.  After evaluation of her presenting symptoms, the medication regimen for her presenting symptoms were discussed & initiated with her consent. Diana Gomez received, was stabilized & discharged on the medications as listed on the discharge medication lists below. She was enrolled in the group counseling sessions being offered & held on this unit to learn coping skills. She presented no other significant pre-existing medical issues that required treatment & or monitoring. She tolerated her treatment regimen without any adverse effects or reactions reported.   Diana Gomez's symptoms responded well to her treatment regimen. Her symptoms has subsided & mood stable. She presents no substance withdrawal symptoms. It appears that she has met the maximum benefit of her hospitalization. She is currently mentally & medically stable to continue mental health care & medication management on an outpatient basis as noted below.  She is provided with all the necessary information needed to make this appointment without problems.   Upon discharge, Mandi adamantly denies any suicidal/homicidal ideations, auditory/visual hallucinations delusional thoughts, paranoia or substance withdrawal symptoms. She left [facility] with all personal belongings in no apparent distress. Transportation per boyfriend.  Physical Findings: AIMS: Facial and Oral Movements Muscles of Facial Expression: None, normal Lips and Perioral Area: None, normal Jaw: None, normal Tongue: None, normal,Extremity Movements Upper (arms, wrists, hands, fingers): None, normal Lower (legs, knees, ankles, toes): None, normal, Trunk Movements Neck, shoulders, hips: None, normal, Overall Severity Severity of abnormal movements (highest score from questions above): None, normal Incapacitation due to abnormal movements: None, normal Patient's awareness of abnormal movements (rate only patient's report): No Awareness, Dental Status Current problems with teeth and/or dentures?: No Does patient usually wear dentures?: No  CIWA:    COWS:     Musculoskeletal: Strength & Muscle Tone: within normal limits Gait & Station: normal Patient leans: N/A  Psychiatric Specialty Exam: Physical Exam  Nursing note and vitals reviewed. Constitutional: She appears well-developed.  Eyes: Pupils are equal, round, and reactive to light.  Neck: Normal range of motion.  Cardiovascular:  Elevated  blood pressure & pulse rate  Respiratory: Effort normal.  Genitourinary:    Genitourinary Comments: Deferred   Musculoskeletal: Normal range of motion.  Neurological: She is alert.  Skin: Skin is warm.    Review of Systems  Constitutional: Negative for chills and fever.  Respiratory: Negative for cough, shortness of breath and wheezing.   Cardiovascular: Negative for chest pain and palpitations.  Gastrointestinal: Negative for abdominal pain, heartburn and nausea.  Neurological:  Negative for dizziness and headaches.  Psychiatric/Behavioral: Positive for depression (Stabilized with medication prior to discharge) and substance abuse (Hx. alcohol use disorder). Negative for hallucinations, memory loss and suicidal ideas. The patient has insomnia (Stabil;ized with medication prior to discharge). The patient is not nervous/anxious.     Blood pressure (!) 128/94, pulse (!) 130, temperature (!) 97.3 F (36.3 C), temperature source Oral, resp. rate 16, height 5' 2.6" (1.59 m), weight 66.2 kg, SpO2 99 %.Body mass index is 26.2 kg/m.  See md's discharge SRA     Has this patient used any form of tobacco in the last 30 days? (Cigarettes, Smokeless Tobacco, Cigars, and/or Pipes): N/A  Blood Alcohol level:  Lab Results  Component Value Date   ETH 204 (H) 94/49/6759   Metabolic Disorder Labs:  No results found for: HGBA1C, MPG No results found for: PROLACTIN No results found for: CHOL, TRIG, HDL, CHOLHDL, VLDL, LDLCALC  See Psychiatric Specialty Exam and Suicide Risk Assessment completed by Attending Physician prior to discharge.  Discharge destination:  Home  Is patient on multiple antipsychotic therapies at discharge:  No   Has Patient had three or more failed trials of antipsychotic monotherapy by history:  No  Recommended Plan for Multiple Antipsychotic Therapies: NA  Allergies as of 11/29/2018   No Known Allergies     Medication List    TAKE these medications     Indication  hydrOXYzine 25 MG tablet Commonly known as: ATARAX/VISTARIL Take 1 tablet (25 mg total) by mouth 3 (three) times daily as needed for anxiety.  Indication: Feeling Anxious   traZODone 50 MG tablet Commonly known as: DESYREL Take 1 tablet (50 mg total) by mouth at bedtime as needed for sleep.  Indication: Trouble Sleeping   venlafaxine XR 150 MG 24 hr capsule Commonly known as: EFFEXOR-XR Take 1 capsule (150 mg total) by mouth daily. For depression Start taking on: November 30, 2018 What changed:   how much to take  additional instructions  Indication: Major Depressive Disorder      Follow-up [facility] Follow up on 12/06/2018.   Why: Therapy appointment with Jacinto Reap is Thursday, 10/15 at 1:00p.  Please call office within 24 hours of discharge to conifirm appt.  Contact information: [address] [place] [address] [phone]          Follow-up recommendations: Activity:  As tolerated Diet: As recommended by your primary care doctor. Keep all scheduled follow-up appointments as recommended.   Comments: Prescriptions given at discharge.  Patient agreeable to plan.  Given opportunity to ask questions.  Appears to feel comfortable with discharge denies any current suicidal or homicidal thought. Patient is also instructed prior to discharge to: Take all medications as prescribed by his/her mental healthcare provider. Report any adverse effects and or reactions from the medicines to his/her outpatient provider promptly. Patient has been instructed & cautioned: To not engage in alcohol and or illegal drug use while on prescription medicines. In the event of worsening symptoms, patient is  instructed to call the crisis hotline, 911 and or go to the nearest ED for appropriate evaluation and treatment of symptoms. To follow-up with his/her primary care provider for your other medical issues, concerns and or health care needs.  Signed: Lindell Spar, NP, PMHNP, FNP-BC 11/29/2018, 10:58 AM

## 2018-12-14 DIAGNOSIS — F411 Generalized anxiety disorder: Secondary | ICD-10-CM | POA: Diagnosis not present

## 2018-12-14 DIAGNOSIS — F410 Panic disorder [episodic paroxysmal anxiety] without agoraphobia: Secondary | ICD-10-CM | POA: Diagnosis not present

## 2018-12-14 DIAGNOSIS — F4010 Social phobia, unspecified: Secondary | ICD-10-CM | POA: Diagnosis not present

## 2018-12-14 DIAGNOSIS — F341 Dysthymic disorder: Secondary | ICD-10-CM | POA: Diagnosis not present

## 2018-12-17 DIAGNOSIS — U071 COVID-19: Secondary | ICD-10-CM | POA: Diagnosis not present

## 2018-12-17 DIAGNOSIS — R05 Cough: Secondary | ICD-10-CM | POA: Diagnosis not present

## 2018-12-17 DIAGNOSIS — Z20828 Contact with and (suspected) exposure to other viral communicable diseases: Secondary | ICD-10-CM | POA: Diagnosis not present

## 2018-12-20 DIAGNOSIS — F341 Dysthymic disorder: Secondary | ICD-10-CM | POA: Diagnosis not present

## 2018-12-20 DIAGNOSIS — F411 Generalized anxiety disorder: Secondary | ICD-10-CM | POA: Diagnosis not present

## 2018-12-20 DIAGNOSIS — F410 Panic disorder [episodic paroxysmal anxiety] without agoraphobia: Secondary | ICD-10-CM | POA: Diagnosis not present

## 2018-12-20 DIAGNOSIS — F4010 Social phobia, unspecified: Secondary | ICD-10-CM | POA: Diagnosis not present

## 2018-12-27 ENCOUNTER — Emergency Department (HOSPITAL_COMMUNITY)
Admission: EM | Admit: 2018-12-27 | Discharge: 2018-12-27 | Disposition: A | Discharge status: Home / Self Care | Payer: BLUE CROSS/BLUE SHIELD | Attending: Emergency Medicine | Admitting: Emergency Medicine

## 2018-12-27 ENCOUNTER — Other Ambulatory Visit: Payer: Self-pay

## 2018-12-27 ENCOUNTER — Emergency Department (HOSPITAL_COMMUNITY): Payer: BLUE CROSS/BLUE SHIELD

## 2018-12-27 DIAGNOSIS — Z79899 Other long term (current) drug therapy: Secondary | ICD-10-CM | POA: Diagnosis not present

## 2018-12-27 DIAGNOSIS — U071 COVID-19: Secondary | ICD-10-CM | POA: Insufficient documentation

## 2018-12-27 DIAGNOSIS — F411 Generalized anxiety disorder: Secondary | ICD-10-CM | POA: Diagnosis not present

## 2018-12-27 DIAGNOSIS — R918 Other nonspecific abnormal finding of lung field: Secondary | ICD-10-CM | POA: Diagnosis not present

## 2018-12-27 DIAGNOSIS — F341 Dysthymic disorder: Secondary | ICD-10-CM | POA: Diagnosis not present

## 2018-12-27 DIAGNOSIS — F410 Panic disorder [episodic paroxysmal anxiety] without agoraphobia: Secondary | ICD-10-CM | POA: Diagnosis not present

## 2018-12-27 DIAGNOSIS — F4010 Social phobia, unspecified: Secondary | ICD-10-CM | POA: Diagnosis not present

## 2018-12-27 DIAGNOSIS — R0602 Shortness of breath: Secondary | ICD-10-CM | POA: Diagnosis not present

## 2018-12-27 LAB — BASIC METABOLIC PANEL
Anion gap: 9 (ref 5–15)
BUN: 9 mg/dL (ref 6–20)
CO2: 23 mmol/L (ref 22–32)
Calcium: 8.7 mg/dL — ABNORMAL LOW (ref 8.9–10.3)
Chloride: 105 mmol/L (ref 98–111)
Creatinine, Ser: 0.69 mg/dL (ref 0.44–1.00)
GFR calc Af Amer: >60 mL/min (ref >60)
GFR calc non Af Amer: >60 mL/min (ref >60)
Glucose, Bld: 62 mg/dL — ABNORMAL LOW (ref 70–99)
Potassium: 3.9 mmol/L (ref 3.5–5.1)
Sodium: 137 mmol/L (ref 135–145)

## 2018-12-27 LAB — CBC WITH DIFFERENTIAL/PLATELET
Abs Immature Granulocytes: 0.01 10*3/uL (ref 0.00–0.07)
Basophils Absolute: 0 10*3/uL (ref 0.0–0.1)
Basophils Relative: 1 %
Eosinophils Absolute: 0.1 10*3/uL (ref 0.0–0.5)
Eosinophils Relative: 3 %
HCT: 40.3 % (ref 36.0–46.0)
Hemoglobin: 12 g/dL (ref 12.0–15.0)
Immature Granulocytes: 0 %
Lymphocytes Relative: 26 %
Lymphs Abs: 1.2 10*3/uL (ref 0.7–4.0)
MCH: 21.9 pg — ABNORMAL LOW (ref 26.0–34.0)
MCHC: 29.8 g/dL — ABNORMAL LOW (ref 30.0–36.0)
MCV: 73.7 fL — ABNORMAL LOW (ref 80.0–100.0)
Monocytes Absolute: 0.4 10*3/uL (ref 0.1–1.0)
Monocytes Relative: 8 %
Neutro Abs: 3 10*3/uL (ref 1.7–7.7)
Neutrophils Relative %: 62 %
Platelets: 222 10*3/uL (ref 150–400)
RBC: 5.47 MIL/uL — ABNORMAL HIGH (ref 3.87–5.11)
RDW: 16 % — ABNORMAL HIGH (ref 11.5–15.5)
WBC: 4.8 10*3/uL (ref 4.0–10.5)
nRBC: 0 % (ref 0.0–0.2)

## 2018-12-27 MED ORDER — SODIUM CHLORIDE (PF) 0.9 % IJ SOLN
INTRAMUSCULAR | Status: AC
  Filled 2018-12-27: qty 50

## 2018-12-27 MED ORDER — IOHEXOL 350 MG/ML SOLN
100.0000 mL | Freq: Once | INTRAVENOUS | Status: AC | PRN
  Administered 2018-12-27: 100 mL via INTRAVENOUS

## 2018-12-27 NOTE — Discharge Instructions (Signed)
Continue quarantining until your symptoms resolve.  Make sure you are getting plenty of rest and drinking a lot of fluids.  If your oxygen level stays above 92%, that is very reassuring.  Return here, if needed, for problems.

## 2018-12-27 NOTE — ED Notes (Signed)
Pt tolerating PO intake without any N/V.

## 2018-12-27 NOTE — ED Notes (Signed)
Pt given sandwich and water to PO challenge.

## 2018-12-27 NOTE — ED Triage Notes (Signed)
Pt states symptoms related to Covid started last Sunday, her boyfriend was tested a few days before and was positive. Pt was tested this Monday and received positive result. She states her sore throat, cough and fvere have subsided since last week but now currently the SOB is getting progressively worse so she was advised by urgent care to be seen in the ED.

## 2018-12-27 NOTE — ED Notes (Signed)
Pt provided with a cup of water and crackers with peanut butter.

## 2018-12-27 NOTE — ED Provider Notes (Signed)
[facility]-EMERGENCY DEPT Provider Note   CSN: [id number] Arrival date & time: 12/27/18  1450     History   Chief Complaint Chief Complaint  Patient presents with   Shortness of Breath    COVID positive    HPI Diana Gomez is a 28 y.o. female.     HPI   She presents for evaluation of weakness and shortness of breath or chest pain.  She is Covid positive, tested on 12/17/2018.  She has been ill about 12 days.  She has a sick contact, her boyfriend, also has COVID-19.  She denies wheezing, fever, chills, nausea, vomiting, productive cough, weakness or dizziness.  She seems more short of breath with walking.  She has been monitoring her oxygen saturation at home and has never been less than 92%.  There are no other known modifying factors.  Past Medical History:  Diagnosis Date   Anxiety    Depression     Patient Active Problem List   Diagnosis Date Noted   Alcohol use disorder, moderate, dependence (HCC)    Severe recurrent major depression without psychotic features (HCC) 11/27/2018    No past surgical history on file.   OB History    Gravida  1   Para      Term      Preterm      AB  1   Living        SAB      TAB  1   Ectopic      Multiple      Live Births               Home Medications    Prior to Admission medications   Medication Sig Start Date End Date Taking? Authorizing Provider  acetaminophen (TYLENOL) 325 MG tablet Take 650 mg by mouth every 6 (six) hours as needed for mild pain or headache.   Yes [provider]  benzonatate (TESSALON) 100 MG capsule Take 100 mg by mouth 3 (three) times daily as needed for cough. 12/17/18  Yes [provider]  hydrOXYzine (VISTARIL) 25 MG capsule Take 25 mg by mouth 3 (three) times daily as needed for anxiety. 12/14/18  Yes [provider]  lamoTRIgine (LAMICTAL) 25 MG tablet Take 25-100 mg by mouth daily. 25mg  for 14 days, 50mg  for 14 days,  100mg  daily 12/14/18  Yes [provider]  naproxen (NAPROSYN) 500 MG tablet Take 500 mg by mouth 2 (two) times daily. 12/17/18  Yes [provider]  polyvinyl alcohol (LIQUIFILM TEARS) 1.4 % ophthalmic solution Place 1 drop into both eyes as needed (irritation).   Yes [provider]  traZODone (DESYREL) 50 MG tablet Take 1 tablet (50 mg total) by mouth at bedtime as needed for sleep. 11/29/18  Yes 12/16/18 I, NP  venlafaxine XR (EFFEXOR-XR) 150 MG 24 hr capsule Take 1 capsule (150 mg total) by mouth daily. For depression 11/30/18  Yes 01/29/19 I, NP  hydrOXYzine (ATARAX/VISTARIL) 25 MG tablet Take 1 tablet (25 mg total) by mouth 3 (three) times daily as needed for anxiety. Patient not taking: Reported on 12/27/2018 11/29/18   Armandina Stammer I, NP    Family History No family history on file.  Social History Social History   Tobacco Use   Smoking status: Never Smoker   Smokeless tobacco: Never Used  Substance Use Topics   Alcohol use: Yes    Comment: weekends; binge   Drug use: No  Allergies   Patient has no known allergies.   Review of Systems Review of Systems  All other systems reviewed and are negative.    Physical Exam Updated Vital Signs BP 121/81    Pulse 89    Resp 17    Ht 5\' 3"  (1.6 m)    Wt 65.8 kg    LMP 12/17/2018    SpO2 100%    BMI 25.69 kg/m   Physical Exam Vitals signs and nursing note reviewed.  Constitutional:      General: She is not in acute distress.    Appearance: She is well-developed. She is not ill-appearing, toxic-appearing or diaphoretic.  HENT:     Head: Normocephalic and atraumatic.     Right Ear: External ear normal.     Left Ear: External ear normal.     Mouth/Throat:     Mouth: Mucous membranes are moist.     Pharynx: No oropharyngeal exudate or posterior oropharyngeal erythema.  Eyes:     Conjunctiva/sclera: Conjunctivae normal.     Pupils: Pupils are equal, round, and reactive to light.    Neck:     Musculoskeletal: Normal range of motion and neck supple.     Trachea: Phonation normal.  Cardiovascular:     Rate and Rhythm: Normal rate and regular rhythm.     Heart sounds: Normal heart sounds.  Pulmonary:     Effort: Pulmonary effort is normal. No respiratory distress.     Breath sounds: Normal breath sounds. No stridor. No wheezing or rhonchi.  Abdominal:     General: There is no distension.     Palpations: Abdomen is soft.     Tenderness: There is no abdominal tenderness.  Musculoskeletal: Normal range of motion.  Skin:    General: Skin is warm and dry.  Neurological:     Mental Status: She is alert and oriented to person, place, and time.     Cranial Nerves: No cranial nerve deficit.     Sensory: No sensory deficit.     Motor: No abnormal muscle tone.     Coordination: Coordination normal.  Psychiatric:        Mood and Affect: Mood normal.        Behavior: Behavior normal.        Thought Content: Thought content normal.        Judgment: Judgment normal.      ED Treatments / Results  Labs (all labs ordered are listed, but only abnormal results are displayed) Labs Reviewed  BASIC METABOLIC PANEL - Abnormal; Notable for the following components:      Result Value   Glucose, Bld 62 (*)    Calcium 8.7 (*)    All other components within normal limits  CBC WITH DIFFERENTIAL/PLATELET - Abnormal; Notable for the following components:   RBC 5.47 (*)    MCV 73.7 (*)    MCH 21.9 (*)    MCHC 29.8 (*)    RDW 16.0 (*)    All other components within normal limits    EKG EKG Interpretation  Date/Time:  Thursday December 27 2018 17:31:57 EST Ventricular Rate:  89 PR Interval:    QRS Duration: 92 QT Interval:  383 QTC Calculation: 466 R Axis:   66 Text Interpretation: Sinus rhythm since last tracing no significant change Confirmed by Daleen Bo [phone]) on 12/27/2018 7:34:13 PM   Radiology Ct Angio Chest Pe W/cm &/or Wo Cm  Result Date:  12/27/2018 CLINICAL DATA:  Symptoms related to COVID-19,  PE suspected EXAM: CT ANGIOGRAPHY CHEST WITH CONTRAST TECHNIQUE: Multidetector CT imaging of the chest was performed using the standard protocol during bolus administration of intravenous contrast. Multiplanar CT image reconstructions and MIPs were obtained to evaluate the vascular anatomy. CONTRAST:  OMNIPAQUE IOHEXOL 350 MG/ML SOLN COMPARISON:  Chest radiograph 12/27/2018 FINDINGS: Cardiovascular: Satisfactory opacification the pulmonary arteries to the segmental level. No pulmonary artery filling defects are identified. Central pulmonary arteries are normal caliber. No elevation of the RV/LV ratio (0.78). Normal heart size. No pericardial effusion. None aneurysmal thoracic aorta. Normal 3 vessel branching of the arch. Mediastinum/Nodes: Few low-attenuation lymph nodes in the mediastinum are likely reactive. No pathologically enlarged mediastinal, hilar or axillary adenopathy. Thyroid gland and thoracic inlet are unremarkable. No acute abnormality of the trachea or esophagus. Lungs/Pleura: There are patchy areas of subpleural predominant ground-glass opacity most pronounced in both lower lobes and in the anterior segment right upper lobe. No pneumothorax. No effusion. No suspicious nodules or masses. Upper Abdomen: No acute abnormalities present in the visualized portions of the upper abdomen. Musculoskeletal: No acute osseous abnormality or suspicious osseous lesion. No suspicious chest wall lesions Review of the MIP images confirms the above findings. IMPRESSION: 1. No evidence of pulmonary embolism. 2. Patchy areas of subpleural predominant ground-glass opacity most pronounced in both lower lobes and in the anterior segment right upper lobe, concerning for a multifocal pneumonia compatible with a COVID-19 etiology. Electronically Signed   By: Kreg Shropshire M.D.   On: 12/27/2018 21:01   Dg Chest Port 1 View  Result Date: 12/27/2018 CLINICAL DATA:   Shortness of breath EXAM: PORTABLE CHEST 1 VIEW COMPARISON:  12/10/2006 FINDINGS: The heart size and mediastinal contours are within normal limits. Both lungs are clear. The visualized skeletal structures are unremarkable. IMPRESSION: No active disease. Electronically Signed   By: Jasmine Pang M.D.   On: 12/27/2018 18:56    Procedures .Critical Care Performed by: Mancel Bale, MD Authorized by: Mancel Bale, MD   Critical care provider statement:    Critical care time (minutes):  35   Critical care start time:  12/27/2018 4:40 PM   Critical care end time:  12/27/2018 9:35 PM   Critical care time was exclusive of:  Separately billable procedures and treating other patients   Critical care was time spent personally by me on the following activities:  Blood draw for specimens, development of treatment plan with patient or surrogate, discussions with consultants, evaluation of patient's response to treatment, examination of patient, obtaining history from patient or surrogate, ordering and performing treatments and interventions, ordering and review of laboratory studies, pulse oximetry, re-evaluation of patient's condition, review of old charts and ordering and review of radiographic studies   (including critical care time)  Medications Ordered in ED Medications  sodium chloride (PF) 0.9 % injection (has no administration in time range)  iohexol (OMNIPAQUE) 350 MG/ML injection 100 mL (100 mLs Intravenous Contrast Given 12/27/18 2039)     Initial Impression / Assessment and Plan / ED Course  I have reviewed the triage vital signs and the nursing notes.  Pertinent labs & imaging results that were available during my care of the patient were reviewed by me and considered in my medical decision making (see chart for details).  Clinical Course as of Dec 27 1133  Thu Dec 27, 2018  1930 Normal except MCV and RBC indices low  CBC with Differential(!) [EW]  1930 Normal except glucose low   Basic metabolic panel(!) [EW]  1930  No infiltrate or CHF, image interpreted by me  [facility] [EW]  2126 Multifocal pneumonia, consistent with COVID-19 infection, no PE.  Interpreted by me.  CT Angio Chest PE W/Cm &/Or Wo Cm [EW]    Clinical Course User Index [EW] Mancel BaleWentz, Alanah Sakuma, MD        Patient Vitals for the past 24 hrs:  BP Pulse Resp SpO2 Height Weight  12/27/18 2133 121/81 89 17 100 % -- --  12/27/18 2000 (!) 130/93 85 (!) 23 100 % -- --  12/27/18 1904 129/87 91 (!) 30 100 % -- --  12/27/18 1543 -- -- -- -- 5\' 3"  (1.6 m) 65.8 kg  12/27/18 1542 (!) 130/93 86 (!) 28 100 % -- --  12/27/18 1541 -- -- -- 100 % -- --    At D/C Reevaluation with update and discussion. After initial assessment and treatment, an updated evaluation reveals she is comfortable has no further complaints.  Findings discussed with the patient and all questions were answered. Mancel BaleElliott Nuno Brubacher   Medical Decision Making: Viral infection consistent with COVID-19 known diagnosis, 10 days into illness.  Patient with mild tachypnea, PE negative.  Chest x-ray negative, CT does show viral pneumonia.  Oxygenation is normal, and not requiring intervention.  She is stable for outpatient management in the home setting.  She is potentially still infectious, going symptoms.  She plans on quarantining until she feels better.  Diana Gomez was evaluated in Emergency Department on 12/27/2018 for the symptoms described in the history of present illness. She was evaluated in the context of the global COVID-19 pandemic, which necessitated consideration that the patient might be at risk for infection with the SARS-CoV-2 virus that causes COVID-19. Institutional protocols and algorithms that pertain to the evaluation of patients at risk for COVID-19 are in a state of rapid change based on information released by regulatory bodies including the CDC and federal and state organizations. These policies and algorithms were  followed during the patient's care in the ED.   CRITICAL CARE- yes Performed by: Mancel BaleElliott Roman Sandall  Nursing Notes Reviewed/ Care Coordinated Applicable Imaging Reviewed Interpretation of Laboratory Data incorporated into ED treatment  The patient appears reasonably screened and/or stabilized for discharge and I doubt any other medical condition or other [facility] requiring further screening, evaluation, or treatment in the ED at this time prior to discharge.  Plan: Home Medications-OTC as needed; Home Treatments-, fluids, gradual advance activity; return here if the recommended treatment, does not improve the symptoms; Recommended follow up- PCP prn   Final Clinical Impressions(s) / ED Diagnoses   Final diagnoses:  COVID-19 virus infection    ED Discharge Orders    None       Mancel BaleWentz, Aili Casillas, MD 12/28/18 1138

## 2019-01-15 DIAGNOSIS — Z0000 Encounter for general adult medical examination without abnormal findings: Secondary | ICD-10-CM | POA: Diagnosis not present

## 2019-01-15 DIAGNOSIS — D649 Anemia, unspecified: Secondary | ICD-10-CM | POA: Diagnosis not present

## 2019-01-15 DIAGNOSIS — Z113 Encounter for screening for infections with a predominantly sexual mode of transmission: Secondary | ICD-10-CM | POA: Diagnosis not present

## 2019-01-15 DIAGNOSIS — Z0001 Encounter for general adult medical examination with abnormal findings: Secondary | ICD-10-CM | POA: Diagnosis not present

## 2019-01-15 DIAGNOSIS — Z124 Encounter for screening for malignant neoplasm of cervix: Secondary | ICD-10-CM | POA: Diagnosis not present

## 2019-01-15 DIAGNOSIS — F1020 Alcohol dependence, uncomplicated: Secondary | ICD-10-CM | POA: Diagnosis not present

## 2019-01-21 ENCOUNTER — Encounter (HOSPITAL_COMMUNITY): Payer: Self-pay | Admitting: Emergency Medicine

## 2019-01-21 ENCOUNTER — Other Ambulatory Visit: Payer: Self-pay

## 2019-01-21 ENCOUNTER — Emergency Department (HOSPITAL_COMMUNITY): Payer: BLUE CROSS/BLUE SHIELD

## 2019-01-21 ENCOUNTER — Emergency Department (HOSPITAL_COMMUNITY)
Admission: EM | Admit: 2019-01-21 | Discharge: 2019-01-22 | Disposition: A | Discharge status: Home / Self Care | Payer: BLUE CROSS/BLUE SHIELD | Attending: Emergency Medicine | Admitting: Emergency Medicine

## 2019-01-21 DIAGNOSIS — R0789 Other chest pain: Secondary | ICD-10-CM

## 2019-01-21 DIAGNOSIS — Z79899 Other long term (current) drug therapy: Secondary | ICD-10-CM | POA: Insufficient documentation

## 2019-01-21 DIAGNOSIS — U071 COVID-19: Secondary | ICD-10-CM | POA: Insufficient documentation

## 2019-01-21 DIAGNOSIS — Z20828 Contact with and (suspected) exposure to other viral communicable diseases: Secondary | ICD-10-CM | POA: Diagnosis not present

## 2019-01-21 DIAGNOSIS — R0602 Shortness of breath: Secondary | ICD-10-CM | POA: Diagnosis not present

## 2019-01-21 DIAGNOSIS — R079 Chest pain, unspecified: Secondary | ICD-10-CM | POA: Diagnosis not present

## 2019-01-21 DIAGNOSIS — R000 Tachycardia, unspecified: Secondary | ICD-10-CM | POA: Diagnosis not present

## 2019-01-21 LAB — CBC
HCT: 35.4 % — ABNORMAL LOW (ref 36.0–46.0)
Hemoglobin: 10.8 g/dL — ABNORMAL LOW (ref 12.0–15.0)
MCH: 22.2 pg — ABNORMAL LOW (ref 26.0–34.0)
MCHC: 30.5 g/dL (ref 30.0–36.0)
MCV: 72.8 fL — ABNORMAL LOW (ref 80.0–100.0)
Platelets: 293 10*3/uL (ref 150–400)
RBC: 4.86 MIL/uL (ref 3.87–5.11)
RDW: 16.9 % — ABNORMAL HIGH (ref 11.5–15.5)
WBC: 4.1 10*3/uL (ref 4.0–10.5)
nRBC: 0 % (ref 0.0–0.2)

## 2019-01-21 LAB — I-STAT BETA HCG BLOOD, ED (MC, WL, AP ONLY): I-stat hCG, quantitative: <5 m[IU]/mL (ref <5)

## 2019-01-21 LAB — BASIC METABOLIC PANEL
Anion gap: 8 (ref 5–15)
BUN: 8 mg/dL (ref 6–20)
CO2: 26 mmol/L (ref 22–32)
Calcium: 8.7 mg/dL — ABNORMAL LOW (ref 8.9–10.3)
Chloride: 103 mmol/L (ref 98–111)
Creatinine, Ser: 0.84 mg/dL (ref 0.44–1.00)
GFR calc Af Amer: >60 mL/min (ref >60)
GFR calc non Af Amer: >60 mL/min (ref >60)
Glucose, Bld: 79 mg/dL (ref 70–99)
Potassium: 4.1 mmol/L (ref 3.5–5.1)
Sodium: 137 mmol/L (ref 135–145)

## 2019-01-21 LAB — TROPONIN I (HIGH SENSITIVITY): Troponin I (High Sensitivity): <2 ng/L (ref <18)

## 2019-01-21 NOTE — ED Triage Notes (Addendum)
Pt here from home for cxp, on and off for 2 weeks but got worse today. Pt states it feels like she's breathing in ice cold air, c/o weakness in arms and legs and tremors. Pt tested covid positive 5 weeks ago, dx w/ pneumonia. Pt's covid symptoms had resolved prior to cxp.

## 2019-01-22 LAB — D-DIMER, QUANTITATIVE: D-Dimer, Quant: <0.27 ug/mL-FEU (ref 0.00–0.50)

## 2019-01-22 LAB — TROPONIN I (HIGH SENSITIVITY): Troponin I (High Sensitivity): <2 ng/L (ref <18)

## 2019-01-22 LAB — POC SARS CORONAVIRUS 2 AG -  ED: SARS Coronavirus 2 Ag: NEGATIVE

## 2019-01-22 LAB — SARS CORONAVIRUS 2 (TAT 6-24 HRS): SARS Coronavirus 2: POSITIVE — AB

## 2019-01-22 MED ORDER — IBUPROFEN 600 MG PO TABS: 600.0000 mg | ORAL_TABLET | Freq: Four times a day (QID) | ORAL | 0 refills | Status: AC | PRN

## 2019-01-22 MED ORDER — SODIUM CHLORIDE 0.9 % IV BOLUS
1000.0000 mL | Freq: Once | INTRAVENOUS | Status: AC
  Administered 2019-01-22: 1000 mL via INTRAVENOUS

## 2019-01-22 MED ORDER — ACETAMINOPHEN 500 MG PO TABS: 500.0000 mg | ORAL_TABLET | Freq: Four times a day (QID) | ORAL | 0 refills | Status: DC | PRN

## 2019-01-22 NOTE — ED Provider Notes (Signed)
Received patient at signout from [facility].  Refer to provider note for full history and physical examination.  Briefly, patient is a 28 year old female presenting for evaluation of central chest pain with occasional shortness of breath.  She tested positive for COVID-19 earlier this month.  Noted that she was tachycardic up to the 150s at home.  Work-up thus far has been reassuring with negative troponins, mild anemia though she has a history of iron deficiency anemia and tells me that she tries her best to take her iron supplement but is not always compliant.  She is currently pending D-dimer and administration of 1 L fluids.  If D-dimer is negative, plan for discharge home with referral to cardiology.  Physical Exam  BP 129/88 (BP Location: Right Arm)   Pulse (!) 110   Temp 98.4 F (36.9 C) (Oral)   Resp 18   LMP 01/21/2019   SpO2 100%   Physical Exam Vitals signs and nursing note reviewed.  Constitutional:      General: She is not in acute distress.    Appearance: She is well-developed.     Comments: Resting comfortably in bed  HENT:     Head: Normocephalic and atraumatic.  Eyes:     General:        Right eye: No discharge.        Left eye: No discharge.     Conjunctiva/sclera: Conjunctivae normal.  Neck:     Vascular: No JVD.     Trachea: No tracheal deviation.  Cardiovascular:     Rate and Rhythm: Normal rate.  Pulmonary:     Effort: Pulmonary effort is normal.     Comments: Speaking in full sentences without difficulty, SPO2 saturations 100% on room air. Abdominal:     General: There is no distension.  Skin:    General: Skin is warm and dry.     Findings: No erythema.  Neurological:     Mental Status: She is alert.  Psychiatric:        Behavior: Behavior normal.     ED Course/Procedures     Procedures  MDM  UMEKA WRENCH was evaluated in Emergency Department on 01/22/2019 for the symptoms described in the history of present illness. She was evaluated in  the context of the global COVID-19 pandemic, which necessitated consideration that the patient might be at risk for infection with the SARS-CoV-2 virus that causes COVID-19. Institutional protocols and algorithms that pertain to the evaluation of patients at risk for COVID-19 are in a state of rapid change based on information released by regulatory bodies including the CDC and federal and state organizations. These policies and algorithms were followed during the patient's care in the ED.   D-dimer is negative.  Heart rate improved with IV fluids.  She is resting comfortably in no apparent distress.  She had orthostatic tachycardia but no orthostatic hypotension when orthostatic vital signs were checked.  Recommend follow-up with cardiology on an outpatient basis.  In the meantime recommend anti-inflammatories, fluids, rest.  Her point-of-care Covid test was negative, will obtain outpatient Covid test.  She will follow-up with cardiology on an outpatient basis.  Discussed strict ED return precautions. Patient verbalized understanding of and agreement with plan and is safe for discharge home at this time.        Renita Papa, PA-C 01/22/19 6213    Merrily Pew, MD 01/23/19 [phone]

## 2019-01-22 NOTE — ED Provider Notes (Addendum)
[facility]   CSN: [id number] Arrival date & time: 01/21/19  2116     History   Chief Complaint Chief Complaint  Patient presents with  . Chest Pain    HPI Diana Gomez is a 28 y.o. female.     Patient presents to the emergency department with a chief complaint of chest pain.  She states that for the past day she has had central chest pain.  She states that sometimes it radiates up to her neck.  She reports occasional shortness of breath.  She states that her heart has been racing.  She states that it was in the 150s several times yesterday.  She denies history of this.  Denies any significant stimulant use.  She does report drinking a cup of coffee per day.  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  She denies any history of PE or DVT.  Denies any leg swelling or calf pain.  He does not use oral contraceptives.  She does report history of iron deficiency anemia, and states that she is on her menstrual cycle.  She denies any abdominal pain.  The history is provided by the patient. No language interpreter was used.    Past Medical History:  Diagnosis Date  . Anxiety   . Depression     Patient Active Problem List   Diagnosis Date Noted  . Alcohol use disorder, moderate, dependence (HCC)   . Severe recurrent major depression without psychotic features (HCC) 11/27/2018    History reviewed. No pertinent surgical history.   OB History    Gravida  1   Para      Term      Preterm      AB  1   Living        SAB      TAB  1   Ectopic      Multiple      Live Births               Home Medications    Prior to Admission medications   Medication Sig Start Date End Date Taking? Authorizing Provider  acetaminophen (TYLENOL) 325 MG tablet Take 650 mg by mouth every 6 (six) hours as needed for mild pain or headache.    [provider]  benzonatate  (TESSALON) 100 MG capsule Take 100 mg by mouth 3 (three) times daily as needed for cough. 12/17/18   [provider]  hydrOXYzine (ATARAX/VISTARIL) 25 MG tablet Take 1 tablet (25 mg total) by mouth 3 (three) times daily as needed for anxiety. Patient not taking: Reported on 12/27/2018 11/29/18   Armandina StammerNwoko, Agnes I, NP  hydrOXYzine (VISTARIL) 25 MG capsule Take 25 mg by mouth 3 (three) times daily as needed for anxiety. 12/14/18   [provider]  lamoTRIgine (LAMICTAL) 25 MG tablet Take 25-100 mg by mouth daily. 25mg  for 14 days, 50mg  for 14 days, 100mg  daily 12/14/18   [provider]  naproxen (NAPROSYN) 500 MG tablet Take 500 mg by mouth 2 (two) times daily. 12/17/18   [provider]  polyvinyl alcohol (LIQUIFILM TEARS) 1.4 % ophthalmic solution Place 1 drop into both eyes as needed (irritation).    [provider]  traZODone (DESYREL) 50 MG tablet Take 1 tablet (50 mg total) by mouth at bedtime as needed for sleep. 11/29/18   Armandina StammerNwoko, Agnes I, NP  venlafaxine XR (  EFFEXOR-XR) 150 MG 24 hr capsule Take 1 capsule (150 mg total) by mouth daily. For depression 11/30/18   Encarnacion Slates, NP    Family History History reviewed. No pertinent family history.  Social History Social History   Tobacco Use  . Smoking status: Never Smoker  . Smokeless tobacco: Never Used  Substance Use Topics  . Alcohol use: Yes    Comment: weekends; binge  . Drug use: No     Allergies   Patient has no known allergies.   Review of Systems Review of Systems  All other systems reviewed and are negative.    Physical Exam Updated Vital Signs BP 129/88 (BP Location: Right Arm)   Pulse (!) 110   Temp 98.4 F (36.9 C) (Oral)   Resp 18   LMP 01/21/2019   SpO2 100%   Physical Exam Vitals signs and nursing note reviewed.  Constitutional:      General: She is not in acute distress.    Appearance: She is well-developed.  HENT:     Head: Normocephalic and atraumatic.   Eyes:     Conjunctiva/sclera: Conjunctivae normal.  Neck:     Musculoskeletal: Neck supple.  Cardiovascular:     Rate and Rhythm: Normal rate and regular rhythm.     Heart sounds: No murmur.  Pulmonary:     Effort: Pulmonary effort is normal. No respiratory distress.     Breath sounds: Normal breath sounds.  Abdominal:     Palpations: Abdomen is soft.     Tenderness: There is no abdominal tenderness.  Musculoskeletal: Normal range of motion.  Skin:    General: Skin is warm and dry.  Neurological:     Mental Status: She is alert and oriented to person, place, and time.  Psychiatric:        Mood and Affect: Mood normal.        Behavior: Behavior normal.      ED Treatments / Results  Labs (all labs ordered are listed, but only abnormal results are displayed) Labs Reviewed  BASIC METABOLIC PANEL - Abnormal; Notable for the following components:      Result Value   Calcium 8.7 (*)    All other components within normal limits  CBC - Abnormal; Notable for the following components:   Hemoglobin 10.8 (*)    HCT 35.4 (*)    MCV 72.8 (*)    MCH 22.2 (*)    RDW 16.9 (*)    All other components within normal limits  D-DIMER, QUANTITATIVE (NOT AT [facility])  D-DIMER, QUANTITATIVE (NOT AT [facility])  I-STAT BETA HCG BLOOD, ED ([facility], [facility], [facility] ONLY)  POC SARS CORONAVIRUS 2 AG -  ED  TROPONIN I (HIGH SENSITIVITY)  TROPONIN I (HIGH SENSITIVITY)    EKG EKG Interpretation  Date/Time:  Monday January 21 2019 21:46:07 EST Ventricular Rate:  116 PR Interval:  152 QRS Duration: 80 QT Interval:  338 QTC Calculation: 469 R Axis:   83 Text Interpretation: Sinus tachycardia Right atrial enlargement Borderline ECG No significant change since last tracing Confirmed by Merrily Pew [phone]) on 01/21/2019 11:06:31 PM   Radiology Dg Chest 2 View  Result Date: 01/21/2019 CLINICAL DATA:  Chest pain EXAM: CHEST - 2 VIEW COMPARISON:  12/27/2018 FINDINGS: Heart and mediastinal contours are within  normal limits. No focal opacities or effusions. No acute bony abnormality. IMPRESSION: No active cardiopulmonary disease. Electronically Signed   By: Rolm Baptise M.D.   On: 01/21/2019 21:40    Procedures Procedures (  including critical care time)  Medications Ordered in ED Medications  sodium chloride 0.9 % bolus 1,000 mL (has no administration in time range)     Initial Impression / Assessment and Plan / ED Course  I have reviewed the triage vital signs and the nursing notes.  Pertinent labs & imaging results that were available during my care of the patient were reviewed by me and considered in my medical decision making (see chart for details).        Patient was sent for chest pain.  She is noted to be tachycardic.  She does have PE risk factors.  Will check D-dimer.  Additionally, patient was recently coronavirus positive.  Reports that she had started to feel better over the past week or so.  Her current symptoms came on a day or 2 ago.  She denies any fever, chills, or cough.  Denies any positional changes.  I did not hear any friction rub.  Given lack of positional changes and friction rub, I do not believe that she has pericardial effusion.  There is no diffuse ST changes to suggest pericarditis.  Chest x-ray is negative.  Laboratory work-up is notable for mild anemia, her hemoglobin is 10.8.  This is lower than she was on her recent visit, but she is currently on her menstrual cycle.  She is taking iron supplements for iron deficient anemia.   We will give a liter of fluid and wait on D-dimer.  Overall, the patient is nontoxic-appearing.  She was noted to have sinus tachycardia on her EKG, but no concerning tachyarrhythmias.  If her D-dimer is normal, and she improves with some fluid, I believe that she is stable for outpatient follow-up.  Patient signed out to [facility], who will continue care.  Final Clinical Impressions(s) / ED Diagnoses   Final diagnoses:  None     ED Discharge Orders    None       Roxy Horseman, PA-C 01/22/19 9735    Roxy Horseman, PA-C 01/22/19 3299    Marily Memos, MD 01/23/19 [phone]

## 2019-01-22 NOTE — Discharge Instructions (Addendum)
Take 600 mg of ibuprofen with food every 6 hours as needed for pain.  You can take 1 to 2 tablets of extra strength Tylenol in between doses of ibuprofen if the ibuprofen seems to wear off before the next dose.  Do not exceed more than 4000 mg of Tylenol daily.  Drink plenty of fluids and get plenty of rest.  Your Covid test will result within 24 hours.  If the test is positive you will need to quarantine at home for another 14 days.  If negative, contact your employer for further recommendations.  Check your oxygen levels with your pulse oximeter.  Seek immediate evaluation if your oxygen levels dropped below 90% on room air.  Follow-up with cardiology on an outpatient basis for reevaluation of your fast heart rate and chest pains.  Return to the emergency department immediately if any concerning signs or symptoms develop such as low oxygen levels, worsening shortness of breath or chest pains, loss of consciousness, persistent vomiting, or high fevers.

## 2019-01-22 NOTE — ED Notes (Signed)
Patient verbalizes understanding of discharge instructions. Opportunity for questioning and answers were provided. Armband removed by staff, pt discharged from ED. Ambulated out to lobby  

## 2019-02-18 ENCOUNTER — Encounter: Payer: Self-pay | Admitting: General Practice

## 2019-10-08 ENCOUNTER — Encounter (HOSPITAL_COMMUNITY): Payer: Self-pay | Admitting: Emergency Medicine

## 2019-10-08 ENCOUNTER — Ambulatory Visit (HOSPITAL_COMMUNITY)
Admission: EM | Admit: 2019-10-08 | Discharge: 2019-10-08 | Disposition: A | Discharge status: Home / Self Care | Payer: 59 | Attending: Family Medicine | Admitting: Family Medicine

## 2019-10-08 ENCOUNTER — Other Ambulatory Visit: Payer: Self-pay

## 2019-10-08 DIAGNOSIS — S40022A Contusion of left upper arm, initial encounter: Secondary | ICD-10-CM

## 2019-10-08 NOTE — ED Triage Notes (Signed)
Pt presents with left arm pain xs 9 days. States large dog jumped on her and put her arm up to block the dog.

## 2019-10-08 NOTE — Discharge Instructions (Signed)
Use ice and elevation Use of arm Keep wrapped as much as you can tolerate Expect slow improvement over next several days

## 2019-10-08 NOTE — ED Provider Notes (Signed)
[facility]    CSN: [id number] Arrival date & time: 10/08/19  1555      History   Chief Complaint Chief Complaint  Patient presents with  . Arm Pain    HPI Diana Gomez is a 29 y.o. female.   HPI  Cases of their very large dog jumped up on her and injured her left arm a couple of days ago.  She states that the dog is quite tall and heavy, and jumped up on his hind feet putting his front pause towards her.  To screen her face she put her arm in front of her self and the dog hit to the back of her upper arm, in the tricep area.  It was scratched a little bruised but not that painful.  Over the last few days she noticed that her arm felt swollen.  Continues painful.  She is taking ibuprofen.  Today when she got dressed when she put on her shirt she realized that 1 sleep was much tighter than the other, and that her left arm was swollen.  It is only swollen in the upper arm.  No swelling in the wrist or distal arm.  Very little pain with use.  She does feel some tension with heavy lifting.  Past Medical History:  Diagnosis Date  . Anxiety   . Depression     Patient Active Problem List   Diagnosis Date Noted  . Alcohol use disorder, moderate, dependence (HCC)   . Severe recurrent major depression without psychotic features (HCC) 11/27/2018    History reviewed. No pertinent surgical history.  OB History    Gravida  1   Para      Term      Preterm      AB  1   Living        SAB      TAB  1   Ectopic      Multiple      Live Births               Home Medications    Prior to Admission medications   Medication Sig Start Date End Date Taking? Authorizing Provider  hydrOXYzine (VISTARIL) 25 MG capsule Take 25 mg by mouth 3 (three) times daily as needed for anxiety. 12/14/18  Yes [provider]  lamoTRIgine (LAMICTAL) 25 MG tablet Take 25-100 mg by mouth daily. 25mg  for 14 days, 50mg  for 14 days, 100mg  daily 12/14/18  Yes [provider]  traZODone (DESYREL) 50 MG tablet Take 1 tablet (50 mg total) by mouth at bedtime as needed for sleep. 11/29/18  Yes I, NP  venlafaxine XR (EFFEXOR-XR) 150 MG 24 hr capsule Take 1 capsule (150 mg total) by mouth daily. For depression 11/30/18  Yes Nwoko, 01/29/19 I, NP  ibuprofen (ADVIL) 600 MG tablet Take 1 tablet (600 mg total) by mouth every 6 (six) hours as needed. 01/22/19   Fawze, Mina A, PA-C  polyvinyl alcohol (LIQUIFILM TEARS) 1.4 % ophthalmic solution Place 1 drop into both eyes as needed (irritation).    [provider]    Family History Family History  Problem Relation Age of Onset  . Cancer Mother   . Hyperlipidemia Father   . Hypertension Father   . Atrial fibrillation Father     Social History Social History   Tobacco Use  . Smoking status: Never Smoker  . Smokeless tobacco: Never Used  Vaping Use  . Vaping Use: Never  used  Substance Use Topics  . Alcohol use: Yes    Comment: weekends; binge  . Drug use: No     Allergies   Patient has no known allergies.   Review of Systems Review of Systems  See HPI Physical Exam Triage Vital Signs ED Triage Vitals  Enc Vitals Group     BP 10/08/19 1643 (!) 142/88     Pulse Rate 10/08/19 1643 85     Resp 10/08/19 1643 18     Temp 10/08/19 1643 99.2 F (37.3 C)     Temp Source 10/08/19 1643 Oral     SpO2 10/08/19 1643 99 %     Weight --      Height --      Head Circumference --      Peak Flow --      Pain Score 10/08/19 1640 7     Pain Loc --      Pain Edu? --      Excl. in GC? --    No data found.  Updated Vital Signs BP (!) 142/88 (BP Location: Right Arm)   Pulse 85   Temp 99.2 F (37.3 C) (Oral)   Resp 18   LMP 09/24/2019   SpO2 99%      Physical Exam Constitutional:      General: She is not in acute distress.    Appearance: She is well-developed and normal weight.  HENT:     Head: Normocephalic and atraumatic.  Eyes:     Conjunctiva/sclera: Conjunctivae  normal.     Pupils: Pupils are equal, round, and reactive to light.  Cardiovascular:     Rate and Rhythm: Normal rate.  Pulmonary:     Effort: Pulmonary effort is normal. No respiratory distress.  Abdominal:     General: There is no distension.     Palpations: Abdomen is soft.  Musculoskeletal:        General: Tenderness present. Normal range of motion.     Cervical back: Normal range of motion.     Comments: Left upper arm measures 33 cm.  Right upper arm measures 32 cm  Skin:    General: Skin is warm and dry.          Comments: Linear bruises, superficial abrasions in this area.  Some soft tissue swelling.  Minor tenderness.  Good range of motion.  No weakness  Neurological:     Mental Status: She is alert.      UC Treatments / Results  Labs (all labs ordered are listed, but only abnormal results are displayed) Labs Reviewed - No data to display  EKG   Radiology No results found.  Procedures Procedures (including critical care time)  Medications Ordered in UC Medications - No data to display  Initial Impression / Assessment and Plan / UC Course  I have reviewed the triage vital signs and the nursing notes.  Pertinent labs & imaging results that were available during my care of the patient were reviewed by me and considered in my medical decision making (see chart for details).     Told patient that she had some soft tissue bruising so you can resolve over time Final Clinical Impressions(s) / UC Diagnoses   Final diagnoses:  Arm contusion, left, initial encounter     Discharge Instructions     Use ice and elevation Use of arm Keep wrapped as much as you can tolerate Expect slow improvement over next several days   ED Prescriptions  None     PDMP not reviewed this encounter.   Eustace Moore, MD 10/08/19 [id number]

## 2020-11-23 ENCOUNTER — Other Ambulatory Visit: Payer: Self-pay | Admitting: Orthopedic Surgery

## 2020-11-23 DIAGNOSIS — G8929 Other chronic pain: Secondary | ICD-10-CM

## 2020-11-23 DIAGNOSIS — M542 Cervicalgia: Secondary | ICD-10-CM

## 2020-12-04 ENCOUNTER — Ambulatory Visit
Admission: RE | Admit: 2020-12-04 | Discharge: 2020-12-04 | Disposition: A | Discharge status: Home / Self Care | Payer: 59 | Source: Ambulatory Visit | Attending: Orthopedic Surgery | Admitting: Orthopedic Surgery

## 2020-12-04 DIAGNOSIS — M542 Cervicalgia: Secondary | ICD-10-CM

## 2020-12-04 DIAGNOSIS — G8929 Other chronic pain: Secondary | ICD-10-CM

## 2020-12-31 ENCOUNTER — Emergency Department (HOSPITAL_COMMUNITY)
Admission: EM | Admit: 2020-12-31 | Discharge: 2020-12-31 | Disposition: A | Discharge status: Home / Self Care | Payer: 59 | Attending: Emergency Medicine | Admitting: Emergency Medicine

## 2020-12-31 ENCOUNTER — Encounter (HOSPITAL_COMMUNITY): Payer: Self-pay | Admitting: Oncology

## 2020-12-31 ENCOUNTER — Other Ambulatory Visit: Payer: Self-pay

## 2020-12-31 DIAGNOSIS — R39198 Other difficulties with micturition: Secondary | ICD-10-CM | POA: Insufficient documentation

## 2020-12-31 DIAGNOSIS — R202 Paresthesia of skin: Secondary | ICD-10-CM | POA: Diagnosis present

## 2020-12-31 DIAGNOSIS — R531 Weakness: Secondary | ICD-10-CM | POA: Insufficient documentation

## 2020-12-31 LAB — COMPREHENSIVE METABOLIC PANEL
ALT: 21 U/L (ref 0–44)
AST: 21 U/L (ref 15–41)
Albumin: 4.6 g/dL (ref 3.5–5.0)
Alkaline Phosphatase: 78 U/L (ref 38–126)
Anion gap: 6 (ref 5–15)
BUN: 8 mg/dL (ref 6–20)
CO2: 27 mmol/L (ref 22–32)
Calcium: 8.9 mg/dL (ref 8.9–10.3)
Chloride: 104 mmol/L (ref 98–111)
Creatinine, Ser: 0.64 mg/dL (ref 0.44–1.00)
GFR, Estimated: >60 mL/min (ref >60)
Glucose, Bld: 83 mg/dL (ref 70–99)
Potassium: 4 mmol/L (ref 3.5–5.1)
Sodium: 137 mmol/L (ref 135–145)
Total Bilirubin: 0.7 mg/dL (ref 0.3–1.2)
Total Protein: 6.9 g/dL (ref 6.5–8.1)

## 2020-12-31 LAB — CBC WITH DIFFERENTIAL/PLATELET
Abs Immature Granulocytes: 0.01 10*3/uL (ref 0.00–0.07)
Basophils Absolute: 0.1 10*3/uL (ref 0.0–0.1)
Basophils Relative: 1 %
Eosinophils Absolute: 0.1 10*3/uL (ref 0.0–0.5)
Eosinophils Relative: 3 %
HCT: 43.6 % (ref 36.0–46.0)
Hemoglobin: 14.8 g/dL (ref 12.0–15.0)
Immature Granulocytes: 0 %
Lymphocytes Relative: 24 %
Lymphs Abs: 1.2 10*3/uL (ref 0.7–4.0)
MCH: 30.1 pg (ref 26.0–34.0)
MCHC: 33.9 g/dL (ref 30.0–36.0)
MCV: 88.6 fL (ref 80.0–100.0)
Monocytes Absolute: 0.4 10*3/uL (ref 0.1–1.0)
Monocytes Relative: 9 %
Neutro Abs: 3.2 10*3/uL (ref 1.7–7.7)
Neutrophils Relative %: 63 %
Platelets: 219 10*3/uL (ref 150–400)
RBC: 4.92 MIL/uL (ref 3.87–5.11)
RDW: 11.8 % (ref 11.5–15.5)
WBC: 5 10*3/uL (ref 4.0–10.5)
nRBC: 0 % (ref 0.0–0.2)

## 2020-12-31 LAB — URINALYSIS, ROUTINE W REFLEX MICROSCOPIC
Bilirubin Urine: NEGATIVE
Glucose, UA: NEGATIVE mg/dL
Hgb urine dipstick: NEGATIVE
Ketones, ur: NEGATIVE mg/dL
Leukocytes,Ua: NEGATIVE
Nitrite: NEGATIVE
Protein, ur: NEGATIVE mg/dL
Specific Gravity, Urine: 1.002 — ABNORMAL LOW (ref 1.005–1.030)
pH: 6 (ref 5.0–8.0)

## 2020-12-31 LAB — I-STAT BETA HCG BLOOD, ED (MC, WL, AP ONLY): I-stat hCG, quantitative: <5 m[IU]/mL (ref <5)

## 2020-12-31 MED ORDER — DIAZEPAM 5 MG PO TABS
5.0000 mg | ORAL_TABLET | Freq: Once | ORAL | Status: AC
  Administered 2020-12-31: 5 mg via ORAL
  Filled 2020-12-31: qty 1

## 2020-12-31 MED ORDER — LIDOCAINE 5 % EX PTCH
1.0000 | MEDICATED_PATCH | CUTANEOUS | Status: DC
  Administered 2020-12-31: 1 via TRANSDERMAL
  Filled 2020-12-31: qty 1

## 2020-12-31 MED ORDER — ACETAMINOPHEN 325 MG PO TABS
650.0000 mg | ORAL_TABLET | Freq: Once | ORAL | Status: AC
  Administered 2020-12-31: 650 mg via ORAL
  Filled 2020-12-31: qty 2

## 2020-12-31 NOTE — ED Provider Notes (Signed)
Emergency Medicine Provider Triage Evaluation Note  Diana Gomez , a 30 y.o. female  was evaluated in triage.  Pt complains of bladder incontinence. Multiple neurologic complaints over the last several months.  Review of Systems  Positive: Urinary incontinence Negative: dysuria  Physical Exam  BP (!) 151/94 (BP Location: Left Arm)   Pulse 98   Temp 98 F (36.7 C) (Oral)   Resp 20   Ht 5\' 4"  (1.626 m)   Wt 75.8 kg   LMP 12/20/2020 (Approximate)   SpO2 100%   BMI 28.67 kg/m  Gen:   Awake, no distress   Resp:  Normal effort  MSK:   Moves extremities without difficulty   Medical Decision Making  Medically screening exam initiated at 3:32 PM.  Appropriate orders placed.  LEXIA VANDEVENDER was informed that the remainder of the evaluation will be completed by another provider, this initial triage assessment does not replace that evaluation, and the importance of remaining in the ED until their evaluation is complete.     Claire Shown 12/31/20 1532    13/10/22, MD 01/01/21 0730

## 2020-12-31 NOTE — Discharge Instructions (Signed)
Please follow up with neurology.

## 2020-12-31 NOTE — ED Provider Notes (Signed)
[place] DEPT Provider Note   CSN: [id number] Arrival date & time: 12/31/20  1409     History Chief Complaint  Patient presents with   Neurologic Problem    Diana Gomez is a 30 y.o. female.  This is a 30 y.o. female with significant medical history as below, including etoh abuse, mdd who presents to the ED with complaint of multiple complaints.  Patient reports over the past 6 months she has been experiencing left-sided arm burning sensation, left-sided leg weakness, urinary issues.  She reports that today she was at work and she was try to use the bathroom, unable to urinate, she sat down at desk and she had urinated on herself.   Duration: 6 months Onset: Gradual Timing: Intermittent Description: Aching, burning, cramping sensation to the arm Severity: Mild Exacerbating/Alleviating Factors: Worse with increased stress at work, increased activity at work Associated Symptoms: intermittent ha resolved with excedrin Pertinent Negatives: No fevers, chills, IV drug use, spinal injections, bowel incontinence.  Denies abdominal pain, nausea or vomiting, no chest pain, dyspnea, recent travel or sick contacts.  No numbness or tingling, no saddle paresthesias.  Context: Patient ports that she was eval by orthopedic surgery, diagnosed with thoracic outlet syndrome, she started physical therapy but symptoms have not significant improved.  Taking her home medications as prescribed including Lamictal.  No recent alcohol use, no illicit drug use.  No recent falls or injuries.   The history is provided by the patient. No language interpreter was used.  Neurologic Problem Pertinent negatives include no chest pain, no abdominal pain, no headaches and no shortness of breath.      Past Medical History:  Diagnosis Date   Anxiety    Depression     Patient Active Problem List   Diagnosis Date Noted   Alcohol use disorder, moderate, dependence ([place])     Severe recurrent major depression without psychotic features ([place]) 11/27/2018    History reviewed. No pertinent surgical history.   OB History     Gravida  1   Para      Term      Preterm      AB  1   Living         SAB      IAB  1   Ectopic      Multiple      Live Births              Family History  Problem Relation Age of Onset   Cancer Mother    Hyperlipidemia Father    Hypertension Father    Atrial fibrillation Father     Social History   Tobacco Use   Smoking status: Never   Smokeless tobacco: Never  Vaping Use   Vaping Use: Never used  Substance Use Topics   Alcohol use: Yes    Comment: weekends; binge   Drug use: No    Home Medications Prior to Admission medications   Medication Sig Start Date End Date Taking? Authorizing Provider  hydrOXYzine (VISTARIL) 25 MG capsule Take 25 mg by mouth 3 (three) times daily as needed for anxiety. 12/14/18   [provider]  ibuprofen (ADVIL) 600 MG tablet Take 1 tablet (600 mg total) by mouth every 6 (six) hours as needed. 01/22/19   Fawze, Mina A, PA-C  lamoTRIgine (LAMICTAL) 25 MG tablet Take 25-100 mg by mouth daily. 25mg  for 14 days, 50mg  for 14 days, 100mg  daily 12/14/18   [provider]  polyvinyl alcohol (LIQUIFILM TEARS) 1.4 % ophthalmic solution Place 1 drop into both eyes as needed (irritation).    [provider]  traZODone (DESYREL) 50 MG tablet Take 1 tablet (50 mg total) by mouth at bedtime as needed for sleep. 11/29/18   Armandina Stammer I, NP  venlafaxine XR (EFFEXOR-XR) 150 MG 24 hr capsule Take 1 capsule (150 mg total) by mouth daily. For depression 11/30/18   Sanjuana Kava, NP    Allergies    Patient has no known allergies.  Review of Systems   Review of Systems  Constitutional:  Negative for chills and fever.  HENT:  Negative for facial swelling and trouble swallowing.   Eyes:  Negative for photophobia and visual disturbance.  Respiratory:  Negative for  cough and shortness of breath.   Cardiovascular:  Negative for chest pain and palpitations.  Gastrointestinal:  Negative for abdominal pain, nausea and vomiting.  Endocrine: Negative for polydipsia and polyuria.  Genitourinary:  Positive for difficulty urinating. Negative for hematuria.  Musculoskeletal:  Positive for myalgias. Negative for gait problem and joint swelling.  Skin:  Negative for pallor and rash.  Neurological:  Negative for syncope and headaches.  Psychiatric/Behavioral:  Negative for agitation and confusion.    Physical Exam Updated Vital Signs BP (!) 136/93   Pulse 97   Temp 98 F (36.7 C) (Oral)   Resp 18   Ht 5\' 4"  (1.626 m)   Wt 75.8 kg   LMP 12/20/2020 (Approximate)   SpO2 97%   BMI 28.67 kg/m   Physical Exam Vitals and nursing note reviewed.  Constitutional:      General: She is not in acute distress.    Appearance: Normal appearance.  HENT:     Head: Normocephalic and atraumatic.     Right Ear: External ear normal.     Left Ear: External ear normal.     Nose: Nose normal.     Mouth/Throat:     Mouth: Mucous membranes are moist.  Eyes:     General: No scleral icterus.       Right eye: No discharge.        Left eye: No discharge.     Extraocular Movements: Extraocular movements intact.     Pupils: Pupils are equal, round, and reactive to light.  Cardiovascular:     Rate and Rhythm: Normal rate and regular rhythm.     Pulses: Normal pulses.     Heart sounds: Normal heart sounds.  Pulmonary:     Effort: Pulmonary effort is normal. No respiratory distress.     Breath sounds: Normal breath sounds.  Abdominal:     General: Abdomen is flat.     Tenderness: There is no abdominal tenderness.  Musculoskeletal:        General: Normal range of motion.     Cervical back: Normal range of motion.     Right lower leg: No edema.     Left lower leg: No edema.     Comments: No midline spinous process tenderness to palpation or question.  No crepitus or  step-off.  Rectal tone is intact  Skin:    General: Skin is warm and dry.     Capillary Refill: Capillary refill takes less than 2 seconds.  Neurological:     Mental Status: She is alert and oriented to person, place, and time.     GCS: GCS eye subscore is 4. GCS verbal subscore is 5. GCS motor subscore is 6.  Cranial Nerves: Cranial nerves 2-12 are intact. No dysarthria or facial asymmetry.     Sensory: Sensation is intact.     Motor: Motor function is intact. No weakness, abnormal muscle tone or pronator drift.     Coordination: Coordination is intact. Finger-Nose-Finger Test normal.     Gait: Gait is intact.     Comments: 5/5 strength B/L UE B/L LE  Psychiatric:        Mood and Affect: Mood normal.        Behavior: Behavior normal.    ED Results / Procedures / Treatments   Labs (all labs ordered are listed, but only abnormal results are displayed) Labs Reviewed  URINALYSIS, ROUTINE W REFLEX MICROSCOPIC - Abnormal; Notable for the following components:      Result Value   Color, Urine COLORLESS (*)    Specific Gravity, Urine 1.002 (*)    All other components within normal limits  COMPREHENSIVE METABOLIC PANEL  CBC WITH DIFFERENTIAL/PLATELET  LAMOTRIGINE LEVEL  I-STAT BETA HCG BLOOD, ED ([facility], [facility], [facility] ONLY)    EKG None  Radiology No results found.  Procedures Procedures   Medications Ordered in ED Medications  diazepam (VALIUM) tablet 5 mg (5 mg Oral Given 12/31/20 1804)  acetaminophen (TYLENOL) tablet 650 mg (650 mg Oral Given 12/31/20 1804)    ED Course  I have reviewed the triage vital signs and the nursing notes.  Pertinent labs & imaging results that were available during my care of the patient were reviewed by me and considered in my medical decision making (see chart for details).    MDM Rules/Calculators/A&P                          CC: multiple complaints  This patient complains of above; this involves an extensive number of treatment options and  is a complaint that carries with it a high risk of complications and morbidity. Vital signs were reviewed. Serious etiologies considered.  Record review:  Previous records obtained and reviewed   Work up as above, notable for:  Lab results that were available during my care of the patient were reviewed by me and considered in my medical decision making.    Management: Given valium, tylenol, lidoderm patch  Reassessment:  Patient does report feeling somewhat better.  Given that her symptoms have been ongoing for greater than 6 months doubt acute spinal cord abnormality including cord compression, cauda equina. Physical exam is reassuring, no saddle paresthesias, no focal numbness.  No midline spinous process tenderness.  Is ambulatory with a steady gait.  Neurologic exam is nonfocal.  Will send for Lamictal level. Bladder scan at bedside without retention Pt voiding spontaneously while in the ED  Ambulatory referral for neurology f/u   The patient improved significantly and was discharged in stable condition. Detailed discussions were had with the patient regarding current findings, and need for close f/u with PCP or on call doctor. The patient has been instructed to return immediately if the symptoms worsen in any way for re-evaluation. Patient verbalized understanding and is in agreement with current care plan. All questions answered prior to discharge.        This chart was dictated using voice recognition software.  Despite best efforts to proofread,  errors can occur which can change the documentation meaning.  Final Clinical Impression(s) / ED Diagnoses Final diagnoses:  Paresthesia  Weakness    Rx / DC Orders ED Discharge Orders  Ordered    Ambulatory referral to Neurology       Comments: An appointment is requested in approximately: 1 week   12/31/20 1726             Jeanell Sparrow, DO 01/01/21 0130

## 2020-12-31 NOTE — ED Triage Notes (Signed)
Pt c/o neurological issues starting 5 months ago.  Pt reports issues w/ her neck and shoulders.  States today she had a scant amount of urine pass w/o having the urge to use the bathroom.

## 2021-01-04 LAB — LAMOTRIGINE LEVEL: Lamotrigine Lvl: 4 ug/mL (ref 2.0–20.0)

## 2021-01-06 ENCOUNTER — Encounter: Payer: Self-pay | Admitting: Neurology

## 2021-01-06 ENCOUNTER — Ambulatory Visit: Payer: 59 | Admitting: Neurology

## 2021-01-06 VITALS — BP 132/88 | HR 101 | Ht 64.0 in | Wt 170.0 lb

## 2021-01-06 DIAGNOSIS — R531 Weakness: Secondary | ICD-10-CM | POA: Diagnosis not present

## 2021-01-06 DIAGNOSIS — F419 Anxiety disorder, unspecified: Secondary | ICD-10-CM | POA: Diagnosis not present

## 2021-01-06 DIAGNOSIS — R32 Unspecified urinary incontinence: Secondary | ICD-10-CM

## 2021-01-06 DIAGNOSIS — F32A Depression, unspecified: Secondary | ICD-10-CM

## 2021-01-06 NOTE — Progress Notes (Signed)
[facility]  PATIENT: Diana Gomez DOB: [birth year]  REQUESTING CLINICIAN: Sloan Leiter, DO HISTORY FROM: Patient  REASON FOR VISIT: Left side pain and numbness, left side weakness and bladder incontinence    HISTORICAL  CHIEF COMPLAINT:  Chief Complaint  Patient presents with   New Patient (Initial Visit)    Rm 12, NP Internal referral for left-sided arm burning sensation, left-sided leg weakness, c/o bladder incontinence     HISTORY OF PRESENT ILLNESS:  This is a 30 year old woman with recently diagnosis of thoracic outlet syndrome, anxiety/depression who is presenting with 71-month history of multiple neurological complaints.  She said about 5 months ago she started having left shoulder pain, left-sided neck pain, pain radiating down left shoulder, left elbow all the way to the left wrist.  Patient said the left arm go numb often.  On top of that her left leg is weak, she has occasional back pain.  She has seen with orthopedic and was diagnosed with thoracic outlet syndrome, she is set to start physical therapy next week.   She is also complaining of tremor in the right thumb.  Patient stated last week she had 1 episode of urinary incontinence, she got concerned and presented to the ED.  During this time she also have a MRI cervical spine which showed mild multilevel disc degeneration but no spinal canal of foraminal stenosis.  She also report recent weight gain in the past year about 30 pounds.  She does have a history of anxiety and depression, she is currently on Vistaril, stated that one of her providers thought she might have bipolar disorder and started her on lamotrigine, currently she is supposed to take lamotrigine 100 mg twice daily.  Denies any recent stressor, currently being being under a lot of stress.    OTHER MEDICAL CONDITIONS: Thoracic outlet syndrome, Depression/Anxiety    REVIEW OF SYSTEMS: Full 14 system review of systems performed and  negative with exception of: as noted in the HPI  ALLERGIES: No Known Allergies  HOME MEDICATIONS: Outpatient Medications Prior to Visit  Medication Sig Dispense Refill   cyclobenzaprine (FLEXERIL) 10 MG tablet Take 10 mg by mouth 3 (three) times daily as needed.     ferrous sulfate 325 (65 FE) MG tablet Take by mouth.     hydrOXYzine (VISTARIL) 25 MG capsule Take 25 mg by mouth 3 (three) times daily as needed for anxiety.     ibuprofen (ADVIL) 600 MG tablet Take 1 tablet (600 mg total) by mouth every 6 (six) hours as needed. 30 tablet 0   lamoTRIgine (LAMICTAL) 25 MG tablet Take 25-100 mg by mouth daily. 25mg  for 14 days, 50mg  for 14 days, 100mg  daily     traZODone (DESYREL) 50 MG tablet Take 1 tablet (50 mg total) by mouth at bedtime as needed for sleep. 30 tablet 0   venlafaxine XR (EFFEXOR-XR) 150 MG 24 hr capsule Take 1 capsule (150 mg total) by mouth daily. For depression 30 capsule 0   polyvinyl alcohol (LIQUIFILM TEARS) 1.4 % ophthalmic solution Place 1 drop into both eyes as needed (irritation).     No facility-administered medications prior to visit.    PAST MEDICAL HISTORY: Past Medical History:  Diagnosis Date   Anxiety    Depression     PAST SURGICAL HISTORY: History reviewed. No pertinent surgical history.  FAMILY HISTORY: Family History  Problem Relation Age of Onset   Cancer Mother    Hyperlipidemia Father    Hypertension Father  Atrial fibrillation Father     SOCIAL HISTORY: Social History   Socioeconomic History   Marital status: Single    Spouse name: Not on file   Number of children: Not on file   Years of education: Not on file   Highest education level: Not on file  Occupational History   Not on file  Tobacco Use   Smoking status: Never   Smokeless tobacco: Never  Vaping Use   Vaping Use: Never used  Substance and Sexual Activity   Alcohol use: Yes    Comment: weekends; binge   Drug use: No   Sexual activity: Not Currently    Birth  control/protection: None  Other Topics Concern   Not on file  Social History Narrative   Not on file   Social Determinants of Health   Financial Resource Strain: Not on file  Food Insecurity: Not on file  Transportation Needs: Not on file  Physical Activity: Not on file  Stress: Not on file  Social Connections: Not on file  Intimate Partner Violence: Not on file    PHYSICAL EXAM GENERAL EXAM/CONSTITUTIONAL: Vitals:  Vitals:   01/06/21 0837  BP: 132/88  Pulse: (!) 101  Weight: 170 lb (77.1 kg)  Height: 5\' 4"  (1.626 m)   Body mass index is 29.18 kg/m. Wt Readings from Last 3 Encounters:  01/06/21 170 lb (77.1 kg)  12/31/20 167 lb (75.8 kg)  12/27/18 145 lb (65.8 kg)   Patient is in no distress; well developed, nourished and groomed; neck is supple  CARDIOVASCULAR: Examination of carotid arteries is normal; no carotid bruits Regular rate and rhythm, no murmurs Examination of peripheral vascular system by observation and palpation is normal  EYES: Pupils round and reactive to light, Visual fields full to confrontation, Extraocular movements intacts,   MUSCULOSKELETAL: Gait, strength, tone, movements noted in Neurologic exam below  NEUROLOGIC: MENTAL STATUS:  No flowsheet data found. awake, alert, oriented to person, place and time recent and remote memory intact normal attention and concentration language fluent, comprehension intact, naming intact fund of knowledge appropriate  CRANIAL NERVE:  2nd, 3rd, 4th, 6th - pupils equal and reactive to light, visual fields full to confrontation, extraocular muscles intact, no nystagmus 5th - facial sensation symmetric 7th - facial strength symmetric 8th - hearing intact 9th - palate elevates symmetrically, uvula midline 11th - shoulder shrug symmetric 12th - tongue protrusion midline  MOTOR:  normal bulk and tone, full strength in the BUE, BLE  SENSORY:  normal and symmetric to light touch, pinprick,  temperature, vibration  COORDINATION:  finger-nose-finger, fine finger movements normal  REFLEXES:  deep tendon reflexes present and symmetric  GAIT/STATION:  normal   DIAGNOSTIC DATA (LABS, IMAGING, TESTING) - I reviewed patient records, labs, notes, testing and imaging myself where available.  Lab Results  Component Value Date   WBC 5.0 12/31/2020   HGB 14.8 12/31/2020   HCT 43.6 12/31/2020   MCV 88.6 12/31/2020   PLT 219 12/31/2020      Component Value Date/Time   NA 137 12/31/2020 1542   K 4.0 12/31/2020 1542   CL 104 12/31/2020 1542   CO2 27 12/31/2020 1542   GLUCOSE 83 12/31/2020 1542   BUN 8 12/31/2020 1542   CREATININE 0.64 12/31/2020 1542   CALCIUM 8.9 12/31/2020 1542   PROT 6.9 12/31/2020 1542   ALBUMIN 4.6 12/31/2020 1542   AST 21 12/31/2020 1542   ALT 21 12/31/2020 1542   ALKPHOS 78 12/31/2020 1542   BILITOT  0.7 12/31/2020 1542   GFRNONAA >60 12/31/2020 1542   GFRAA >60 01/21/2019 2156   No results found for: CHOL, HDL, LDLCALC, LDLDIRECT, TRIG, CHOLHDL No results found for: QIWL7L No results found for: VITAMINB12 Lab Results  Component Value Date   TSH 3.214 11/28/2018    MRI Cervical spine 12/04/2020 Mild multilevel disc degeneration, greatest at C5-C6 and C6-C7. Shallow disc bulges are present at C5-C6 and C6-C7. No significant spinal canal or foraminal stenosis. Straightening of the expected cervical lordosis.    ASSESSMENT AND PLAN  30 y.o. year old female with past medical history of anxiety/depression, recently diagnosed with thoracic outlet syndrome who is presenting with complaint of left-sided neck, shoulder pain traveling down the left arm, left-sided numbness and left leg weakness.  She did have a cervical spine MRI which did not show any spinal canal or foraminal stenosis.  No evidence of radiculopathy.  Because of the weakness and episode of urinary incontinence I will obtain a MRI brain with and without contrast to rule out  demyelinating disease.  I will contact the patient to go over the result.  If the MRI is normal I will strongly advised the patient to follow-up with her PCP for referral to psychiatry/therapist for management of anxiety and depression and possible bipolar disorder.  This was explained to the patient. Return if worse   1. Urinary incontinence, unspecified type   2. Weakness   3. Depression, unspecified depression type   4. Anxiety      PLAN: Continue current medications  MRI Brain to look for central cause of weakness, numbness and urinary incontinence  Follow up with PCP for referral to psychiatry/therapist for management of anxiety/depression  Return if worse   Orders Placed This Encounter  Procedures   MR BRAIN W WO CONTRAST    No orders of the defined types were placed in this encounter.   Return if symptoms worsen or fail to improve.    Windell Norfolk, MD 01/06/2021, 9:19 AM  [facility] [address], [address] [place], [place] [address] [phone]

## 2021-01-06 NOTE — Patient Instructions (Signed)
Continue current medications  MRI Brain to look for central cause of weakness, numbness and urinary incontinence  Follow up with PCP for referral to psychiatry/therapist for management of anxiety/depression  Return if worse

## 2021-01-20 ENCOUNTER — Other Ambulatory Visit: Payer: 59

## 2021-01-25 ENCOUNTER — Other Ambulatory Visit: Payer: 59

## 2021-01-27 ENCOUNTER — Other Ambulatory Visit: Payer: 59

## 2021-02-09 ENCOUNTER — Other Ambulatory Visit: Payer: 59

## 2021-02-26 ENCOUNTER — Other Ambulatory Visit: Payer: 59

## 2021-05-04 ENCOUNTER — Encounter (HOSPITAL_COMMUNITY): Payer: Self-pay

## 2021-05-04 ENCOUNTER — Emergency Department (HOSPITAL_COMMUNITY)
Admission: EM | Admit: 2021-05-04 | Discharge: 2021-05-04 | Disposition: A | Discharge status: Home / Self Care | Payer: 59 | Attending: Emergency Medicine | Admitting: Emergency Medicine

## 2021-05-04 ENCOUNTER — Other Ambulatory Visit: Payer: Self-pay

## 2021-05-04 DIAGNOSIS — Z20822 Contact with and (suspected) exposure to covid-19: Secondary | ICD-10-CM | POA: Diagnosis not present

## 2021-05-04 DIAGNOSIS — R0789 Other chest pain: Secondary | ICD-10-CM | POA: Diagnosis not present

## 2021-05-04 DIAGNOSIS — R7309 Other abnormal glucose: Secondary | ICD-10-CM | POA: Diagnosis not present

## 2021-05-04 DIAGNOSIS — R059 Cough, unspecified: Secondary | ICD-10-CM | POA: Diagnosis present

## 2021-05-04 DIAGNOSIS — R61 Generalized hyperhidrosis: Secondary | ICD-10-CM | POA: Diagnosis not present

## 2021-05-04 DIAGNOSIS — R0602 Shortness of breath: Secondary | ICD-10-CM | POA: Insufficient documentation

## 2021-05-04 HISTORY — DX: Myocarditis, unspecified: I51.4

## 2021-05-04 LAB — CBC WITH DIFFERENTIAL/PLATELET
Abs Immature Granulocytes: 0.03 10*3/uL (ref 0.00–0.07)
Basophils Absolute: 0.1 10*3/uL (ref 0.0–0.1)
Basophils Relative: 1 %
Eosinophils Absolute: 0.1 10*3/uL (ref 0.0–0.5)
Eosinophils Relative: 2 %
HCT: 45.2 % (ref 36.0–46.0)
Hemoglobin: 15.1 g/dL — ABNORMAL HIGH (ref 12.0–15.0)
Immature Granulocytes: 0 %
Lymphocytes Relative: 18 %
Lymphs Abs: 1.4 10*3/uL (ref 0.7–4.0)
MCH: 29.4 pg (ref 26.0–34.0)
MCHC: 33.4 g/dL (ref 30.0–36.0)
MCV: 88.1 fL (ref 80.0–100.0)
Monocytes Absolute: 0.5 10*3/uL (ref 0.1–1.0)
Monocytes Relative: 7 %
Neutro Abs: 5.6 10*3/uL (ref 1.7–7.7)
Neutrophils Relative %: 72 %
Platelets: 253 10*3/uL (ref 150–400)
RBC: 5.13 MIL/uL — ABNORMAL HIGH (ref 3.87–5.11)
RDW: 11.9 % (ref 11.5–15.5)
WBC: 7.7 10*3/uL (ref 4.0–10.5)
nRBC: 0 % (ref 0.0–0.2)

## 2021-05-04 LAB — RESP PANEL BY RT-PCR (FLU A&B, COVID) ARPGX2
Influenza A by PCR: NEGATIVE
Influenza B by PCR: NEGATIVE
SARS Coronavirus 2 by RT PCR: NEGATIVE

## 2021-05-04 LAB — BASIC METABOLIC PANEL
Anion gap: 10 (ref 5–15)
BUN: 12 mg/dL (ref 6–20)
CO2: 24 mmol/L (ref 22–32)
Calcium: 9.2 mg/dL (ref 8.9–10.3)
Chloride: 103 mmol/L (ref 98–111)
Creatinine, Ser: 0.75 mg/dL (ref 0.44–1.00)
GFR, Estimated: >60 mL/min (ref >60)
Glucose, Bld: 129 mg/dL — ABNORMAL HIGH (ref 70–99)
Potassium: 3.9 mmol/L (ref 3.5–5.1)
Sodium: 137 mmol/L (ref 135–145)

## 2021-05-04 LAB — I-STAT BETA HCG BLOOD, ED (MC, WL, AP ONLY): I-stat hCG, quantitative: <5 m[IU]/mL (ref <5)

## 2021-05-04 LAB — D-DIMER, QUANTITATIVE: D-Dimer, Quant: <0.27 ug/mL-FEU (ref 0.00–0.50)

## 2021-05-04 NOTE — Discharge Instructions (Signed)
You were seen in the emergency department for cough and shortness of breath in the setting of a recent surgery.  You had lab work done including a D-dimer that were unremarkable.  Your COVID and flu test were negative.  Please contact the orthopedic department regarding your recent surgery and symptoms.  Return if any worsening or concerning symptoms ?

## 2021-05-04 NOTE — ED Provider Notes (Signed)
[facility]-EMERGENCY DEPT Provider Note   CSN: [id number] Arrival date & time: 05/04/21  1814     History  Chief Complaint  Patient presents with   Shortness of Breath   Cough   Fever    Diana Gomez is a 31 y.o. female.  She had surgery about a month ago for left thoracic outlet syndrome which involved resection of the left first rib and neurolysis of the brachial plexus.  Since then she has had some discomfort in her left upper chest.  It was getting better up until the last week when it seemed to be getting worse again along with shortness of breath.  Associated with a dry cough.  Cough happens in spasms.  No fever but has had some night sweats.  No hemoptysis.  She has an appointment with her orthopedic team in 2 weeks.  She went to urgent care today where she had a chest x-ray that did not show any acute findings.  They sent her here to exclude pulmonary embolism.  The history is provided by the patient.  Shortness of Breath Severity:  Moderate Onset quality:  Gradual Duration:  1 week Timing:  Intermittent Progression:  Unchanged Chronicity:  Recurrent Relieved by:  Nothing Worsened by:  Activity Ineffective treatments:  None tried Associated symptoms: cough   Associated symptoms: no abdominal pain, no fever, no headaches, no hemoptysis, no sore throat, no sputum production and no wheezing   Cough Cough characteristics:  Non-productive Sputum characteristics:  Nondescript Severity:  Moderate Onset quality:  Gradual Duration:  1 week Timing:  Intermittent Progression:  Unchanged Chronicity:  New Smoker: no   Relieved by:  None tried Worsened by:  Nothing Ineffective treatments:  None tried Associated symptoms: shortness of breath   Associated symptoms: no fever, no headaches, no sore throat and no wheezing       Home Medications Prior to Admission medications   Medication Sig Start Date End Date Taking? Authorizing Provider   cyclobenzaprine (FLEXERIL) 10 MG tablet Take 10 mg by mouth 3 (three) times daily as needed. 11/18/20   [provider]  ferrous sulfate 325 (65 FE) MG tablet Take by mouth.    [provider]  hydrOXYzine (VISTARIL) 25 MG capsule Take 25 mg by mouth 3 (three) times daily as needed for anxiety. 12/14/18   [provider]  ibuprofen (ADVIL) 600 MG tablet Take 1 tablet (600 mg total) by mouth every 6 (six) hours as needed. 01/22/19   Fawze, Mina A, PA-C  lamoTRIgine (LAMICTAL) 25 MG tablet Take 25-100 mg by mouth daily. 25mg  for 14 days, 50mg  for 14 days, 100mg  daily 12/14/18   [provider]  traZODone (DESYREL) 50 MG tablet Take 1 tablet (50 mg total) by mouth at bedtime as needed for sleep. 11/29/18   Armandina Stammer I, NP  venlafaxine XR (EFFEXOR-XR) 150 MG 24 hr capsule Take 1 capsule (150 mg total) by mouth daily. For depression 11/30/18   Sanjuana Kava, NP      Allergies    Patient has no known allergies.    Review of Systems   Review of Systems  Constitutional:  Negative for fever.  HENT:  Negative for sore throat.   Respiratory:  Positive for cough and shortness of breath. Negative for hemoptysis, sputum production and wheezing.   Gastrointestinal:  Negative for abdominal pain.  Skin:  Positive for wound.  Neurological:  Negative for headaches.   Physical Exam Updated Vital Signs BP 128/88 (  BP Location: Left Arm)   Pulse (!) 111   Temp 98.3 F (36.8 C) (Oral)   Resp 12   Ht 5\' 4"  (1.626 m)   Wt 74.8 kg   LMP 04/09/2021   SpO2 99%   BMI 28.32 kg/m  Physical Exam Vitals and nursing note reviewed.  Constitutional:      General: She is not in acute distress.    Appearance: She is well-developed.  HENT:     Head: Normocephalic and atraumatic.  Eyes:     Conjunctiva/sclera: Conjunctivae normal.  Cardiovascular:     Rate and Rhythm: Normal rate and regular rhythm.     Heart sounds: No murmur heard. Pulmonary:     Effort: Pulmonary effort  is normal. No respiratory distress.     Breath sounds: Normal breath sounds.  Chest:     Comments: She has a healing surgical incision in her upper chest lower neck.  No erythema or fluctuance. Abdominal:     Palpations: Abdomen is soft.     Tenderness: There is no abdominal tenderness.  Musculoskeletal:        General: No swelling. Normal range of motion.     Cervical back: Neck supple.     Right lower leg: No tenderness. No edema.     Left lower leg: No tenderness. No edema.  Skin:    General: Skin is warm and dry.     Capillary Refill: Capillary refill takes less than 2 seconds.  Neurological:     General: No focal deficit present.     Mental Status: She is alert.    ED Results / Procedures / Treatments   Labs (all labs ordered are listed, but only abnormal results are displayed) Labs Reviewed  CBC WITH DIFFERENTIAL/PLATELET - Abnormal; Notable for the following components:      Result Value   RBC 5.13 (*)    Hemoglobin 15.1 (*)    All other components within normal limits  BASIC METABOLIC PANEL - Abnormal; Notable for the following components:   Glucose, Bld 129 (*)    All other components within normal limits  RESP PANEL BY RT-PCR (FLU A&B, COVID) ARPGX2  D-DIMER, QUANTITATIVE  I-STAT BETA HCG BLOOD, ED ([facility], [facility], [facility] ONLY)    EKG EKG Interpretation  Date/Time:  Tuesday May 04 2021 18:24:37 EDT Ventricular Rate:  110 PR Interval:  132 QRS Duration: 81 QT Interval:  327 QTC Calculation: 443 R Axis:   85 Text Interpretation: Sinus tachycardia Right atrial enlargement Confirmed by Kommor, Madison ([id number]) on 05/05/2021 10:12:39 AM  Radiology No results found.  Procedures Procedures    Medications Ordered in ED Medications - No data to display  ED Course/ Medical Decision Making/ A&P Clinical Course as of 05/05/21 1903  Tue May 04, 2021  2051 Able to review the images from [facility] urgent care that she had done today.  They were read as negative.  Negative  D-dimer here and otherwise unremarkable labs.  Recommended calling her orthopedic team for close follow-up.  Patient comfortable with plan. [MB]    Clinical Course User Index [MB] Terrilee Files, MD                           Medical Decision Making Diana Gomez was evaluated in Emergency Department on 05/04/2021 for the symptoms described in the history of present illness. She was evaluated in the context of the global COVID-19 pandemic, which necessitated consideration that  the patient might be at risk for infection with the SARS-CoV-2 virus that causes COVID-19. Institutional protocols and algorithms that pertain to the evaluation of patients at risk for COVID-19 are in a state of rapid change based on information released by regulatory bodies including the CDC and federal and state organizations. These policies and algorithms were followed during the patient's care in the ED.  This patient complains of left upper chest pain, cough shortness of breath; this involves an extensive number of treatment Options and is a complaint that carries with it a high risk of complications and morbidity. The differential includes PE, musculoskeletal pain, COVID, flu, pneumonia, vascular  I ordered, reviewed and interpreted labs, which included CBC with normal white count, stable hemoglobin, chemistries normal other than mildly elevated glucose, D-dimer negative, pregnancy test negative  Previous records obtained and reviewed including urgent care visit today and prior orthopedic visits Social determinants considered, No significant barriers Critical Interventions: None After the interventions stated above, I reevaluated the patient and found patient to be asymptomatic satting well on room air.  Tachycardia resolved. Admission and further testing considered, no indications for admission or further testing at this time.  Counseled patient to follow-up with her treating providers.  Return instructions  discussed          Final Clinical Impression(s) / ED Diagnoses Final diagnoses:  Cough, unspecified type    Rx / DC Orders ED Discharge Orders     None         Terrilee Files, MD 05/05/21 1905

## 2021-05-04 NOTE — ED Triage Notes (Signed)
Patient c/o SOB, chest pain, cough, and fever, sore throat. ? ?Patient states she had surgery for thoracic outlet syndrome x 1 month ago and states that she has had SOB and a "weird cough" since the surgery. Patient states the cough is constant and is worse when she lays flat. ? ?Patient went to an UC today and was referred to the ED to r/o a PE. ?

## 2021-05-04 NOTE — ED Provider Triage Note (Signed)
Emergency Medicine Provider Triage Evaluation Note ? ?Claire Shown , a 31 y.o. female  was evaluated in triage.  Pt complains of cough. . States she started having cough, chills and sore throat 2 days ago.  Pt had surgery to correct thoracic outlet syndrome on 04/08/2021. She has had sob since then.  ? ?Sent from urgent care for r/o pe ? ?Review of Systems  ?Positive: Cough, chills, sore throat, sob ?Negative: vomiting ? ?Physical Exam  ?BP 128/88 (BP Location: Left Arm)   Pulse (!) 111   Temp 98.3 ?F (36.8 ?C) (Oral)   Resp 12   Ht 5\' 4"  (1.626 m)   Wt 74.8 kg   LMP 04/09/2021   SpO2 99%   BMI 28.32 kg/m?  ?Gen:   Awake, no distress   ?Resp:  Normal effort  ?MSK:   Moves extremities without difficulty  ?Other:  Tachycardic, lungs ctab ? ?Medical Decision Making  ?Medically screening exam initiated at 6:36 PM.  Appropriate orders placed.  KEONIA PASKO was informed that the remainder of the evaluation will be completed by another provider, this initial triage assessment does not replace that evaluation, and the importance of remaining in the ED until their evaluation is complete. ? ? ?  ?Claire Shown, PA-C ?05/04/21 1836 ? ?

## 2021-05-13 IMAGING — CT CT ANGIO CHEST
2 of 6 series · 18 of 36 positions shown · IV contrast (omnipaque)
Comparison: Chest radiograph 12/27/2018

CLINICAL DATA: Symptoms related to W3GMI-17, PE suspected

EXAM:
CT ANGIOGRAPHY CHEST WITH CONTRAST
TECHNIQUE: Multidetector CT imaging of the chest was performed using the
standard protocol during bolus administration of intravenous
contrast. Multiplanar CT image reconstructions and MIPs were
obtained to evaluate the vascular anatomy.
CONTRAST:  100mL OMNIPAQUE IOHEXOL 350 MG/ML SOLN

[Series 5: thins · axial · 0.60mm/px · z∈[+1396,+1640]mm · 17 of 271 slices shown]
[im 14/271  lung]
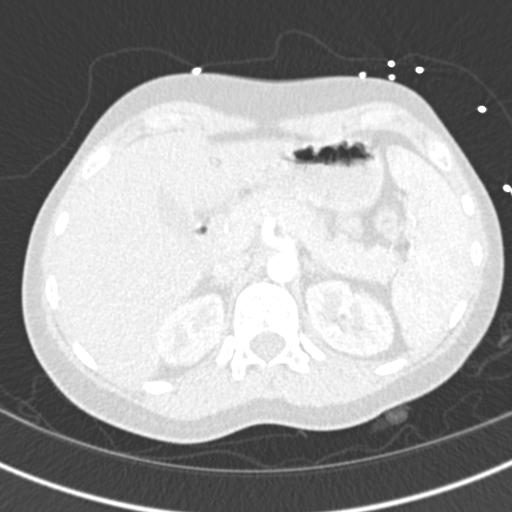
[im 28/271  mediastinal]
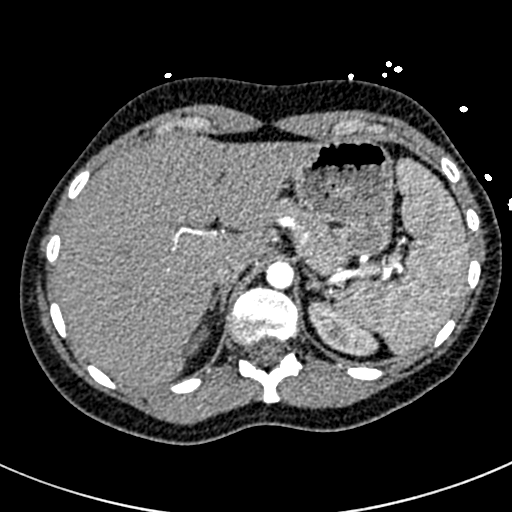
[im 41/271  lung]
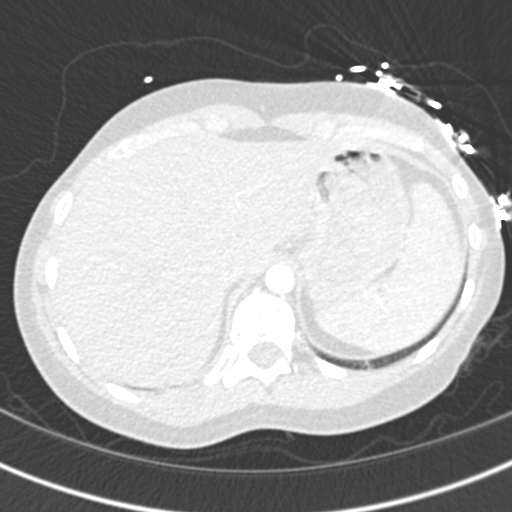
[im 55/271  mediastinal]
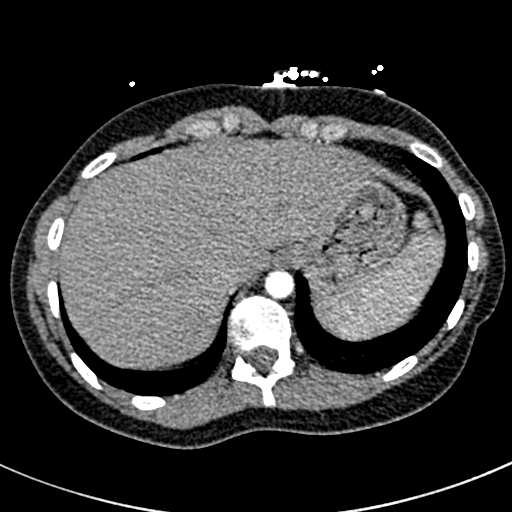
[im 82/271  lung]
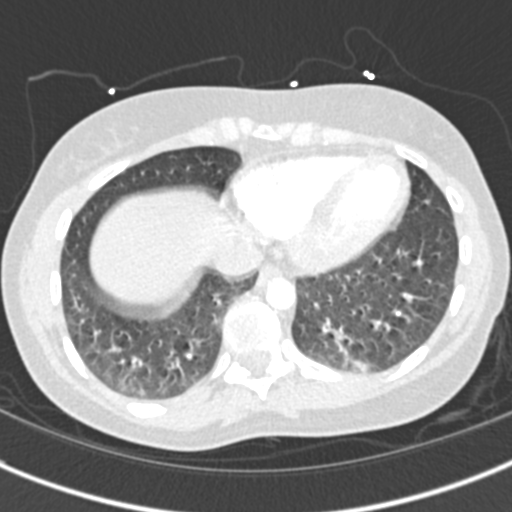
[im 95/271  mediastinal]
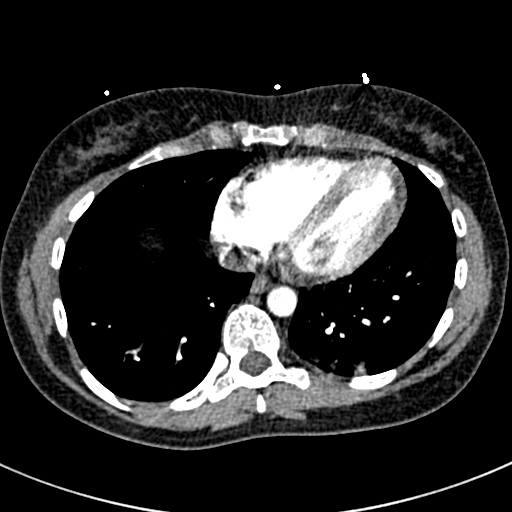
[im 109/271  lung]
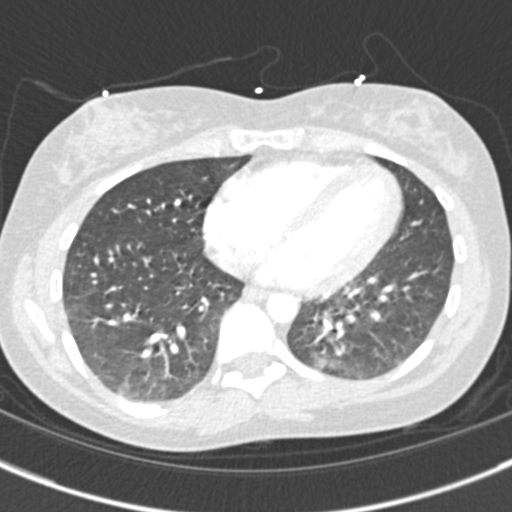
[im 122/271  mediastinal]
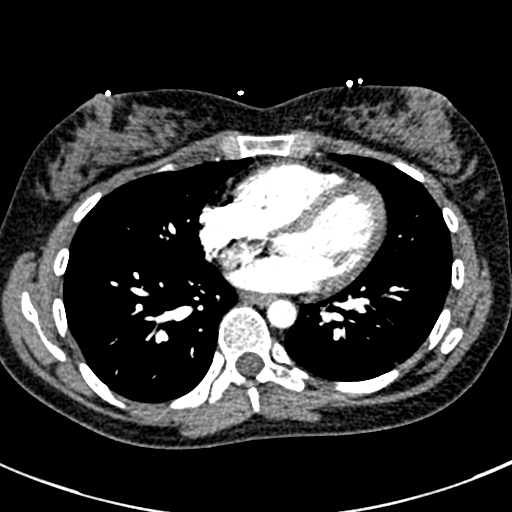
[im 136/271  lung]
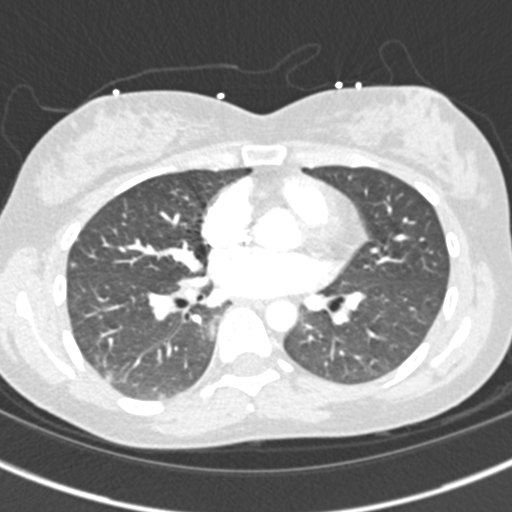
[im 149/271  mediastinal]
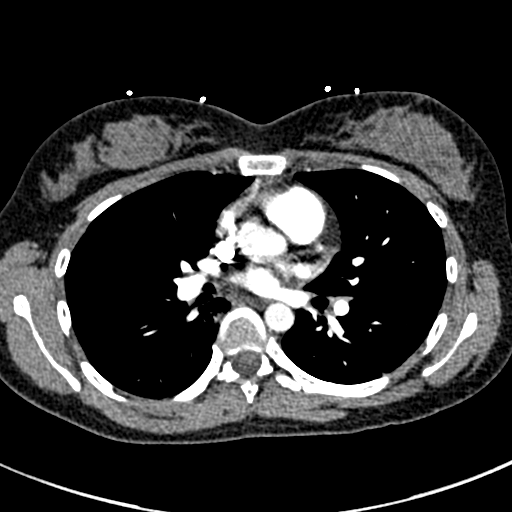
[im 163/271  lung]
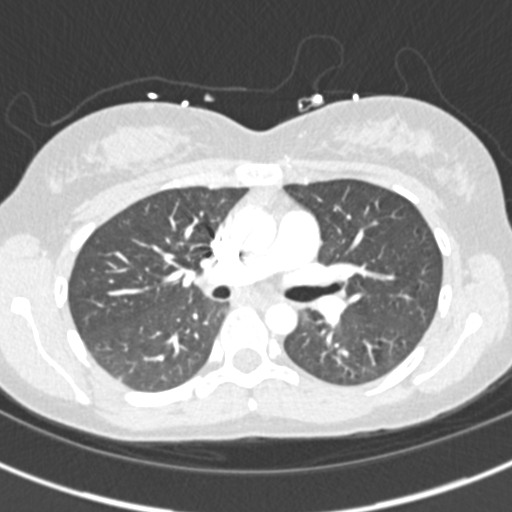
[im 176/271  mediastinal]
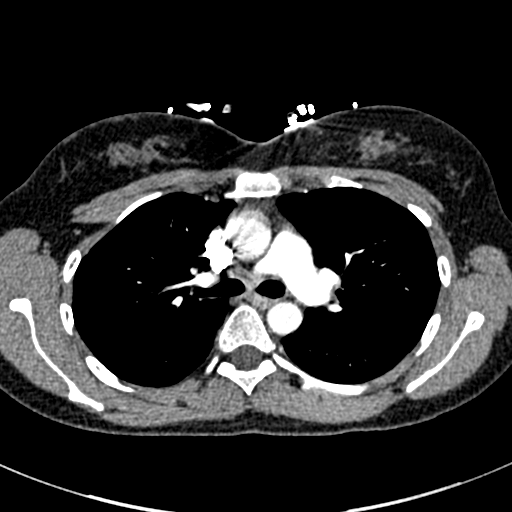
[im 190/271  lung]
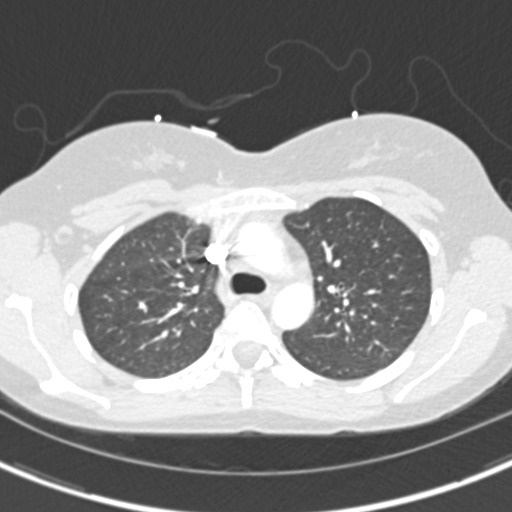
[im 217/271  mediastinal]
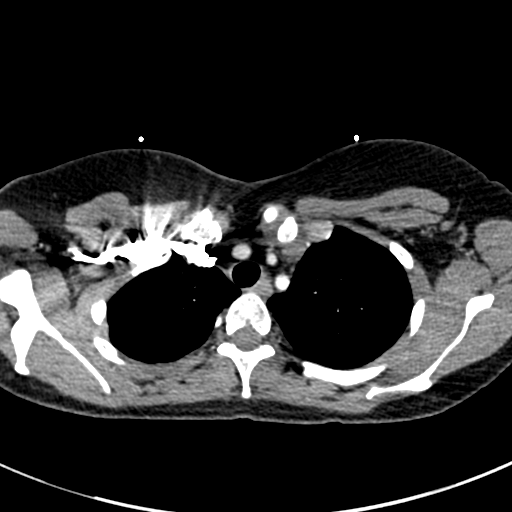
[im 230/271  lung]
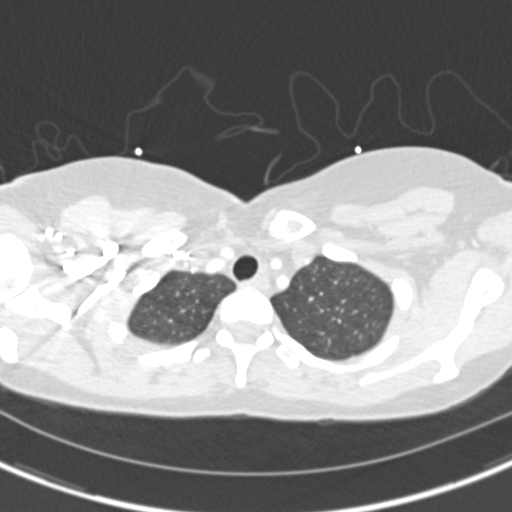
[im 244/271  mediastinal]
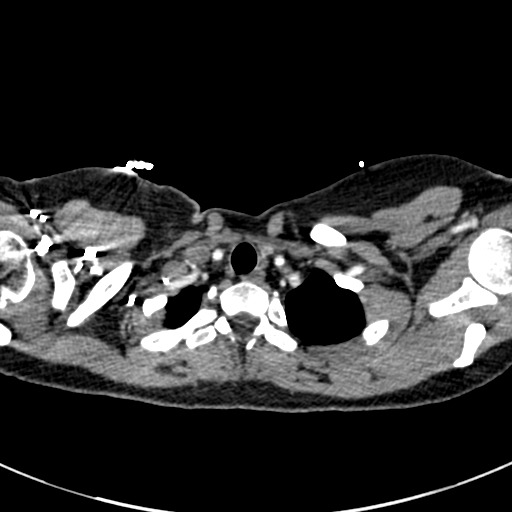
[im 257/271  lung]
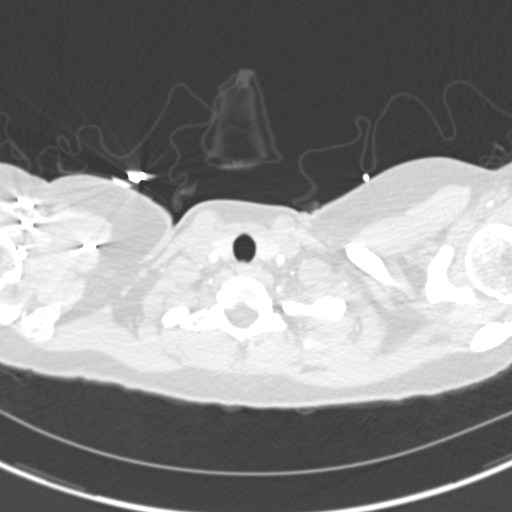

[Series 7: coronal mpr · coronal · 0.53mm/px · 1 of 108 slices shown]
[im 54/108  mediastinal]
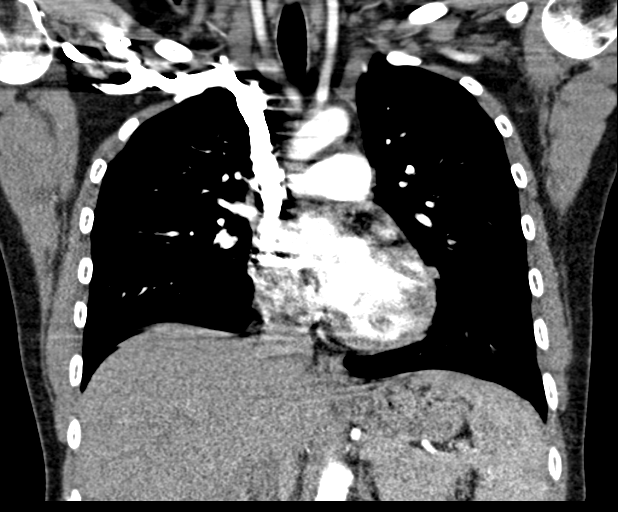

[18 of 36 positions shown; findings below may reference images not displayed]

FINDINGS: Cardiovascular: Satisfactory opacification the pulmonary arteries to
the segmental level. No pulmonary artery filling defects are
identified. Central pulmonary arteries are normal caliber. No
elevation of the RV/LV ratio (0.78).

Normal heart size. No pericardial effusion. None aneurysmal thoracic
aorta. Normal 3 vessel branching of the arch.

Mediastinum/Nodes: Few low-attenuation lymph nodes in the
mediastinum are likely reactive. No pathologically enlarged
mediastinal, hilar or axillary adenopathy. Thyroid gland and
thoracic inlet are unremarkable. No acute abnormality of the trachea
or esophagus.

Lungs/Pleura: There are patchy areas of subpleural predominant
ground-glass opacity most pronounced in both lower lobes and in the
anterior segment right upper lobe. No pneumothorax. No effusion. No
suspicious nodules or masses.

Upper Abdomen: No acute abnormalities present in the visualized
portions of the upper abdomen.

Musculoskeletal: No acute osseous abnormality or suspicious osseous
lesion. No suspicious chest wall lesions

Review of the MIP images confirms the above findings.
IMPRESSION: 1. No evidence of pulmonary embolism.
2. Patchy areas of subpleural predominant ground-glass opacity most
pronounced in both lower lobes and in the anterior segment right
upper lobe, concerning for a multifocal pneumonia compatible with a
W3GMI-17 etiology.

## 2021-05-21 ENCOUNTER — Encounter (HOSPITAL_COMMUNITY): Payer: Self-pay

## 2021-05-21 ENCOUNTER — Emergency Department (HOSPITAL_COMMUNITY)
Admission: EM | Admit: 2021-05-21 | Discharge: 2021-05-21 | Disposition: A | Discharge status: Home / Self Care | Payer: 59 | Attending: Emergency Medicine | Admitting: Emergency Medicine

## 2021-05-21 DIAGNOSIS — F1990 Other psychoactive substance use, unspecified, uncomplicated: Secondary | ICD-10-CM

## 2021-05-21 DIAGNOSIS — T50904A Poisoning by unspecified drugs, medicaments and biological substances, undetermined, initial encounter: Secondary | ICD-10-CM

## 2021-05-21 DIAGNOSIS — F1010 Alcohol abuse, uncomplicated: Secondary | ICD-10-CM

## 2021-05-21 DIAGNOSIS — T450X1A Poisoning by antiallergic and antiemetic drugs, accidental (unintentional), initial encounter: Secondary | ICD-10-CM | POA: Diagnosis present

## 2021-05-21 DIAGNOSIS — T481X1A Poisoning by skeletal muscle relaxants [neuromuscular blocking agents], accidental (unintentional), initial encounter: Secondary | ICD-10-CM | POA: Insufficient documentation

## 2021-05-21 DIAGNOSIS — F419 Anxiety disorder, unspecified: Secondary | ICD-10-CM

## 2021-05-21 DIAGNOSIS — Z20822 Contact with and (suspected) exposure to covid-19: Secondary | ICD-10-CM | POA: Insufficient documentation

## 2021-05-21 LAB — CBC WITH DIFFERENTIAL/PLATELET
Abs Immature Granulocytes: 0.01 10*3/uL (ref 0.00–0.07)
Basophils Absolute: 0.1 10*3/uL (ref 0.0–0.1)
Basophils Relative: 1 %
Eosinophils Absolute: 0.1 10*3/uL (ref 0.0–0.5)
Eosinophils Relative: 1 %
HCT: 45.6 % (ref 36.0–46.0)
Hemoglobin: 15.4 g/dL — ABNORMAL HIGH (ref 12.0–15.0)
Immature Granulocytes: 0 %
Lymphocytes Relative: 35 %
Lymphs Abs: 2 10*3/uL (ref 0.7–4.0)
MCH: 29.4 pg (ref 26.0–34.0)
MCHC: 33.8 g/dL (ref 30.0–36.0)
MCV: 87.2 fL (ref 80.0–100.0)
Monocytes Absolute: 0.4 10*3/uL (ref 0.1–1.0)
Monocytes Relative: 7 %
Neutro Abs: 3.2 10*3/uL (ref 1.7–7.7)
Neutrophils Relative %: 56 %
Platelets: 269 10*3/uL (ref 150–400)
RBC: 5.23 MIL/uL — ABNORMAL HIGH (ref 3.87–5.11)
RDW: 12 % (ref 11.5–15.5)
WBC: 5.8 10*3/uL (ref 4.0–10.5)
nRBC: 0 % (ref 0.0–0.2)

## 2021-05-21 LAB — COMPREHENSIVE METABOLIC PANEL
ALT: 19 U/L (ref 0–44)
AST: 21 U/L (ref 15–41)
Albumin: 4.6 g/dL (ref 3.5–5.0)
Alkaline Phosphatase: 86 U/L (ref 38–126)
Anion gap: 8 (ref 5–15)
BUN: 6 mg/dL (ref 6–20)
CO2: 28 mmol/L (ref 22–32)
Calcium: 8.8 mg/dL — ABNORMAL LOW (ref 8.9–10.3)
Chloride: 103 mmol/L (ref 98–111)
Creatinine, Ser: 0.67 mg/dL (ref 0.44–1.00)
GFR, Estimated: >60 mL/min (ref >60)
Glucose, Bld: 81 mg/dL (ref 70–99)
Potassium: 3.7 mmol/L (ref 3.5–5.1)
Sodium: 139 mmol/L (ref 135–145)
Total Bilirubin: 0.4 mg/dL (ref 0.3–1.2)
Total Protein: 7.5 g/dL (ref 6.5–8.1)

## 2021-05-21 LAB — RESP PANEL BY RT-PCR (FLU A&B, COVID) ARPGX2
Influenza A by PCR: NEGATIVE
Influenza B by PCR: NEGATIVE
SARS Coronavirus 2 by RT PCR: NEGATIVE

## 2021-05-21 LAB — I-STAT BETA HCG BLOOD, ED (MC, WL, AP ONLY): I-stat hCG, quantitative: <5 m[IU]/mL (ref <5)

## 2021-05-21 LAB — RAPID URINE DRUG SCREEN, HOSP PERFORMED
Amphetamines: NOT DETECTED
Barbiturates: NOT DETECTED
Benzodiazepines: NOT DETECTED
Cocaine: POSITIVE — AB
Opiates: NOT DETECTED
Tetrahydrocannabinol: NOT DETECTED

## 2021-05-21 LAB — ETHANOL: Alcohol, Ethyl (B): 215 mg/dL — ABNORMAL HIGH (ref <10)

## 2021-05-21 LAB — SALICYLATE LEVEL: Salicylate Lvl: <7 mg/dL — ABNORMAL LOW (ref 7.0–30.0)

## 2021-05-21 LAB — ACETAMINOPHEN LEVEL: Acetaminophen (Tylenol), Serum: <10 ug/mL — ABNORMAL LOW (ref 10–30)

## 2021-05-21 LAB — MAGNESIUM: Magnesium: 2.5 mg/dL — ABNORMAL HIGH (ref 1.7–2.4)

## 2021-05-21 MED ORDER — SODIUM CHLORIDE 0.9 % IV BOLUS
1000.0000 mL | Freq: Once | INTRAVENOUS | Status: AC
  Administered 2021-05-21: 1000 mL via INTRAVENOUS

## 2021-05-21 NOTE — ED Provider Notes (Signed)
?[facility]-EMERGENCY DEPT ?Provider Note ? ? ?CSN: [id number] ?Arrival date & time: 05/21/21  1405 ? ?  ? ?History ? ?Chief Complaint  ?Patient presents with  ? Drug Overdose  ? ? ?Diana Gomez is a 31 y.o. female. ? ?Here after which she states was self-medicating with Vistaril and Flexeril.  Brought in by family who is concerned that she might of accidentally overdose.  Taking unknown amounts of hydroxyzine and cyclobenzaprine.  Denies suicidal ideation.  She states that she is having bad anxiety and wants to feel better.  Also admits alcohol use.  She also has prescription for Lamictal, trazodone, Effexor.  History of anxiety and depression.  Denies any chest pain, shortness of breath, abdominal pain, nausea, vomiting.  No weakness, tingling. ? ? ? ?  ? ?Home Medications ?Prior to Admission medications   ?Medication Sig Start Date End Date Taking? Authorizing Provider  ?cyclobenzaprine (FLEXERIL) 10 MG tablet Take 10 mg by mouth 3 (three) times daily as needed. 11/18/20   [provider]  ?ferrous sulfate 325 (65 FE) MG tablet Take by mouth.    [provider]  ?hydrOXYzine (VISTARIL) 25 MG capsule Take 25 mg by mouth 3 (three) times daily as needed for anxiety. 12/14/18   [provider]  ?ibuprofen (ADVIL) 600 MG tablet Take 1 tablet (600 mg total) by mouth every 6 (six) hours as needed. 01/22/19   Fawze, Mina A, PA-C  ?lamoTRIgine (LAMICTAL) 25 MG tablet Take 25-100 mg by mouth daily. 25mg  for 14 days, 50mg  for 14 days, 100mg  daily 12/14/18   [provider]  ?traZODone (DESYREL) 50 MG tablet Take 1 tablet (50 mg total) by mouth at bedtime as needed for sleep. 11/29/18   I, NP  ?venlafaxine XR (EFFEXOR-XR) 150 MG 24 hr capsule Take 1 capsule (150 mg total) by mouth daily. For depression 11/30/18   01/29/19, NP  ?   ? ?Allergies    ?Patient has no known allergies.   ? ?Review of Systems   ?Review of Systems ? ?Physical Exam ?Updated  Vital Signs ?BP 118/81   Pulse 95   Temp 98 ?F (36.7 ?C) (Oral)   Resp 18   SpO2 100%  ?Physical Exam ?Vitals and nursing note reviewed.  ?Constitutional:   ?   General: She is not in acute distress. ?   Appearance: She is well-developed. She is not ill-appearing.  ?HENT:  ?   Head: Normocephalic and atraumatic.  ?Eyes:  ?   Conjunctiva/sclera: Conjunctivae normal.  ?   Comments: Pupils are mildly dilated bilaterally  ?Cardiovascular:  ?   Rate and Rhythm: Normal rate and regular rhythm.  ?   Pulses: Normal pulses.  ?   Heart sounds: Normal heart sounds. No murmur heard. ?Pulmonary:  ?   Effort: Pulmonary effort is normal. No respiratory distress.  ?   Breath sounds: Normal breath sounds.  ?Abdominal:  ?   Palpations: Abdomen is soft.  ?   Tenderness: There is no abdominal tenderness.  ?Musculoskeletal:     ?   General: No swelling.  ?   Cervical back: Neck supple.  ?Skin: ?   General: Skin is warm and dry.  ?   Capillary Refill: Capillary refill takes less than 2 seconds.  ?Neurological:  ?   General: No focal deficit present.  ?   Mental Status: She is alert and oriented to person, place, and time.  ?   Cranial Nerves: No cranial  nerve deficit.  ?   Sensory: No sensory deficit.  ?   Motor: No weakness.  ?   Coordination: Coordination normal.  ?   Comments: No sustained clonus, 5+ out of 5 strength throughout, normal sensation, no drift, normal speech she is slightly slow to respond and appears to be intoxicated  ?Psychiatric:     ?   Mood and Affect: Mood normal.  ? ? ?ED Results / Procedures / Treatments   ?Labs ?(all labs ordered are listed, but only abnormal results are displayed) ?Labs Reviewed  ?COMPREHENSIVE METABOLIC PANEL - Abnormal; Notable for the following components:  ?    Result Value  ? Calcium 8.8 (*)   ? All other components within normal limits  ?CBC WITH DIFFERENTIAL/PLATELET - Abnormal; Notable for the following components:  ? RBC 5.23 (*)   ? Hemoglobin 15.4 (*)   ? All other components  within normal limits  ?RESP PANEL BY RT-PCR (FLU A&B, COVID) ARPGX2  ?ETHANOL  ?RAPID URINE DRUG SCREEN, HOSP PERFORMED  ?SALICYLATE LEVEL  ?ACETAMINOPHEN LEVEL  ?MAGNESIUM  ?I-STAT BETA HCG BLOOD, ED ([facility], [facility], [facility] ONLY)  ? ? ?EKG ?EKG Interpretation ? ?Date/Time:  Friday May 21 2021 14:15:08 EDT ?Ventricular Rate:  116 ?PR Interval:  146 ?QRS Duration: 90 ?QT Interval:  368 ?QTC Calculation: 512 ?R Axis:   81 ?Text Interpretation: Sinus tachycardia Anteroseptal infarct, age indeterminate Prolonged QT interval Confirmed by Virgina Norfolk [phone]) on 05/21/2021 2:21:49 PM ? ?Radiology ?No results found. ? ?Procedures ?Procedures  ? ? ?Medications Ordered in ED ?Medications  ?sodium chloride 0.9 % bolus 1,000 mL (1,000 mLs Intravenous New Bag/Given 05/21/21 1458)  ? ? ?ED Course/ Medical Decision Making/ A&P ?  ?                        ?Medical Decision Making ?Amount and/or Complexity of Data Reviewed ?Labs: ordered. ? ? ?Diana Gomez is here with potential accidental overdose.  History of anxiety and depression.  Admits to self-medicating her anxiety here today.  Drinking alcohol, extra doses of Flexeril and Vistaril.  Does have prescription for trazodone, Lamictal, Effexor.  Upon review of her medications and her medication bottles there is a fair amount of tablets in all of her bottles.  She states that she took several extra doses of her Flexeril and Vistaril may be for 5 tablets each but story does not seem to be accurate as she says multiple different amounts to different people.  Clinically she appears intoxicated but she is neurologically intact.  She does not have clonus.  Pupils are mildly dilated.  No obvious major toxidrome on exam.  Mild tachycardia.  Vital signs otherwise unremarkable.  No fever.  She is adamant that she is not suicidal or homicidal.  Family member also thinks that she is mostly self-medicating but shared decision was made to have her evaluated by psychiatry.  Overall EKG per my review  and interpretation shows sinus tachycardia.  QTc is mildly prolonged at 500.  We will give IV fluids, check medical screening labs including Tylenol and salicylate level.  Will check ethanol level.  Nursing staff to talk with poison control but believe there will be a short period of medical clearance needed but will place TTS consultation. ? ?Poison control recommended 6-hour observation and repletion of magnesium and potassium as needed. ? ?Lab work is otherwise unremarkable per my review and interpretation.  Patient will need further observation for medical clearance.  Assuming she continues to do well she will be medically cleared by poison control at around 8 PM.  Handed off to oncoming ED staff. ? ?This chart was dictated using voice recognition software.  Despite best efforts to proofread,  errors can occur which can change the documentation meaning. ? ? ? ? ? ? ? ? ?Final Clinical Impression(s) / ED Diagnoses ?Final diagnoses:  ?Overdose of undetermined intent, initial encounter  ? ? ?Rx / DC Orders ?ED Discharge Orders   ? ? None  ? ?  ? ? ?  ?Virgina NorfolkCuratolo, Reid Nawrot, DO ?05/21/21 1552 ? ?

## 2021-05-21 NOTE — ED Notes (Signed)
Mother of Pt's boyfriend brought Pt to the ED.  She reports she is a mother figure to the Pt because her mother passed when she was young.  She can be reached at: Misty Stanley [phone] ?

## 2021-05-21 NOTE — ED Notes (Signed)
PT woke up and became anxious and started disconnecting monitoring equipment. Provider in to talk with patient and security called on standby. PT cleared for discharge and instructions reviewed with another staffmember per charge nurse. ?

## 2021-05-21 NOTE — ED Notes (Signed)
Pt is adamant this was not an SI attempt.  Sts she was "just anxious."  Pt admits to drinking "[place] and liquor" since last night.    ?

## 2021-05-21 NOTE — ED Triage Notes (Signed)
Pt arrived via POV, pt expressed to family friend she took unknown amt of hydroxyzine and cyclobenzaprine. Pt states several different amts, so unknown actual amt. Pt denies it was SI attempt. Pt states she was just having back anxiety and wanted to "feel better". Also admits to ETOH.  ?

## 2021-05-21 NOTE — ED Notes (Addendum)
BOYfriend's mother states that patient has "female trauma" and cannot have a female sitter and we cannot keep her here. MD made aware and is in the room. ?

## 2021-05-21 NOTE — ED Provider Notes (Signed)
?  Physical Exam  ?BP 105/63   Pulse (!) 103   Temp 98 ?F (36.7 ?C) (Oral)   Resp 19   SpO2 99%  ?Past Medical History:  ?Diagnosis Date  ? Anxiety   ? Depression   ? Myocarditis ([place])   ?  ?Physical Exam ? ?Procedures  ?Procedures ? ?ED Course / MDM  ?  ?Medical Decision Making ?Amount and/or Complexity of Data Reviewed ?Labs: ordered. ? ? ?31 year old female with a history of anxiety, depression, presented with concern for accidental overdose.  She was self-medicating with Vistaril and Flexeril due to severe anxiety and wanted to feel better, also reported she is drinking alcohol.  Her boyfriend's mother who is at bedside and has her mother figure, reports that Diana Gomez has not been drinking for some time, and drink again and was not thinking properly when she took extra doses of her medicine.  She is very specific that she was not trying to overdose and that she had been drinking alcohol and not thinking properly and took a few extra to help with her symptoms. ? ?Medically, she has been observed for 6 hours and has improved, remained medically stable.  Her labs including acetaminophen and salicylate levels were normal.  Noted positive for cocaine, etoh intoxication. ? ?She now is reporting she wants to leave.  She does not want to stay for TTS evaluation, reporting that being in the hospital makes her anxiety worse.  Her mother figure at bedside agrees.  She reports they will have all of her medications, make sure she is not inappropriately taking them.  She ensures that she is safe and will return to the emergency department if she develops any suicidal thoughts.  She again continues to deny any suicidal ideation.  She contracts for safety.  Her mother figure at bedside is supportive and feels safe with discharge plan. ? ? ? ? ?  ?Gareth Morgan, MD ?05/22/21 1221 ? ?

## 2021-05-21 NOTE — ED Notes (Signed)
Spoke with poison control due to intake of hydroxyzine and cyclobenzaprine, unknown exact amt at approx 1230. Recommendation for 6hr obs and repeat EKG for assessment of QTC prolongation.  ?

## 2023-03-25 ENCOUNTER — Emergency Department (HOSPITAL_COMMUNITY)
Admission: EM | Admit: 2023-03-25 | Discharge: 2023-03-26 | Discharge status: Against Medical Advice | Payer: Self-pay | Attending: Emergency Medicine | Admitting: Emergency Medicine

## 2023-03-25 ENCOUNTER — Emergency Department (HOSPITAL_COMMUNITY): Payer: Self-pay

## 2023-03-25 ENCOUNTER — Encounter (HOSPITAL_COMMUNITY): Payer: Self-pay

## 2023-03-25 ENCOUNTER — Other Ambulatory Visit: Payer: Self-pay

## 2023-03-25 DIAGNOSIS — W232XXA Caught, crushed, jammed or pinched between a moving and stationary object, initial encounter: Secondary | ICD-10-CM | POA: Insufficient documentation

## 2023-03-25 DIAGNOSIS — S60111A Contusion of right thumb with damage to nail, initial encounter: Secondary | ICD-10-CM | POA: Insufficient documentation

## 2023-03-25 DIAGNOSIS — Z5321 Procedure and treatment not carried out due to patient leaving prior to being seen by health care provider: Secondary | ICD-10-CM | POA: Insufficient documentation

## 2023-03-25 NOTE — ED Triage Notes (Signed)
Pt presents to the ED c/o right thumb pain.  Pt stated she slammed it in the car door and it got stuck.  Hematoma under the nail noted.
